# Patient Record
Sex: Female | Born: 1950 | Race: White | Hispanic: No | Marital: Married | Smoking: Current every day smoker
Health system: Southern US, Community
[De-identification: ages and names within clinical notes are randomized; demographics above are authoritative.]

## PROBLEM LIST (undated history)

## (undated) DIAGNOSIS — I639 Cerebral infarction, unspecified: Secondary | ICD-10-CM

## (undated) DIAGNOSIS — F419 Anxiety disorder, unspecified: Secondary | ICD-10-CM

## (undated) DIAGNOSIS — C801 Malignant (primary) neoplasm, unspecified: Secondary | ICD-10-CM

## (undated) DIAGNOSIS — I4891 Unspecified atrial fibrillation: Secondary | ICD-10-CM

## (undated) HISTORY — DX: Anxiety disorder, unspecified: F41.9

## (undated) HISTORY — DX: Malignant (primary) neoplasm, unspecified: C80.1

## (undated) HISTORY — DX: Unspecified atrial fibrillation: I48.91

## (undated) HISTORY — DX: Cerebral infarction, unspecified: I63.9

## (undated) HISTORY — PX: ABDOMINAL HYSTERECTOMY: SHX81

## (undated) HISTORY — PX: KNEE SURGERY: SHX244

---

## 2014-02-21 HISTORY — PX: OTHER SURGICAL HISTORY: SHX169

## 2016-08-18 DIAGNOSIS — M5030 Other cervical disc degeneration, unspecified cervical region: Secondary | ICD-10-CM | POA: Insufficient documentation

## 2016-08-18 DIAGNOSIS — I639 Cerebral infarction, unspecified: Secondary | ICD-10-CM | POA: Insufficient documentation

## 2016-08-18 DIAGNOSIS — I482 Chronic atrial fibrillation, unspecified: Secondary | ICD-10-CM | POA: Insufficient documentation

## 2016-08-18 DIAGNOSIS — I10 Essential (primary) hypertension: Secondary | ICD-10-CM | POA: Insufficient documentation

## 2016-08-18 DIAGNOSIS — E785 Hyperlipidemia, unspecified: Secondary | ICD-10-CM | POA: Insufficient documentation

## 2016-08-18 DIAGNOSIS — Z8673 Personal history of transient ischemic attack (TIA), and cerebral infarction without residual deficits: Secondary | ICD-10-CM | POA: Insufficient documentation

## 2016-08-18 DIAGNOSIS — G894 Chronic pain syndrome: Secondary | ICD-10-CM | POA: Insufficient documentation

## 2016-08-18 DIAGNOSIS — G8929 Other chronic pain: Secondary | ICD-10-CM | POA: Insufficient documentation

## 2016-08-18 DIAGNOSIS — M5412 Radiculopathy, cervical region: Secondary | ICD-10-CM

## 2016-08-18 DIAGNOSIS — G8194 Hemiplegia, unspecified affecting left nondominant side: Secondary | ICD-10-CM | POA: Insufficient documentation

## 2016-10-12 ENCOUNTER — Ambulatory Visit: Payer: Medicare Other | Admitting: Pain Medicine

## 2016-10-14 DIAGNOSIS — F17200 Nicotine dependence, unspecified, uncomplicated: Secondary | ICD-10-CM | POA: Insufficient documentation

## 2016-11-02 ENCOUNTER — Encounter: Payer: Self-pay | Admitting: Pain Medicine

## 2016-11-02 ENCOUNTER — Ambulatory Visit
Admission: RE | Admit: 2016-11-02 | Discharge: 2016-11-02 | Disposition: A | Discharge status: Home / Self Care | Payer: Medicare Other | Source: Ambulatory Visit | Attending: Pain Medicine | Admitting: Pain Medicine

## 2016-11-02 ENCOUNTER — Ambulatory Visit: Payer: Medicare Other | Attending: Pain Medicine | Admitting: Pain Medicine

## 2016-11-02 VITALS — Ht 65.0 in | Wt 101.0 lb

## 2016-11-02 DIAGNOSIS — Z7982 Long term (current) use of aspirin: Secondary | ICD-10-CM | POA: Diagnosis not present

## 2016-11-02 DIAGNOSIS — M24532 Contracture, left wrist: Secondary | ICD-10-CM | POA: Insufficient documentation

## 2016-11-02 DIAGNOSIS — R252 Cramp and spasm: Secondary | ICD-10-CM | POA: Insufficient documentation

## 2016-11-02 DIAGNOSIS — Z7901 Long term (current) use of anticoagulants: Secondary | ICD-10-CM | POA: Diagnosis not present

## 2016-11-02 DIAGNOSIS — G894 Chronic pain syndrome: Secondary | ICD-10-CM | POA: Diagnosis not present

## 2016-11-02 DIAGNOSIS — M79605 Pain in left leg: Secondary | ICD-10-CM | POA: Diagnosis not present

## 2016-11-02 DIAGNOSIS — M4696 Unspecified inflammatory spondylopathy, lumbar region: Secondary | ICD-10-CM | POA: Insufficient documentation

## 2016-11-02 DIAGNOSIS — M791 Myalgia: Secondary | ICD-10-CM | POA: Insufficient documentation

## 2016-11-02 DIAGNOSIS — M24542 Contracture, left hand: Secondary | ICD-10-CM | POA: Insufficient documentation

## 2016-11-02 DIAGNOSIS — Z95828 Presence of other vascular implants and grafts: Secondary | ICD-10-CM | POA: Insufficient documentation

## 2016-11-02 DIAGNOSIS — I4891 Unspecified atrial fibrillation: Secondary | ICD-10-CM | POA: Insufficient documentation

## 2016-11-02 DIAGNOSIS — M4180 Other forms of scoliosis, site unspecified: Secondary | ICD-10-CM | POA: Insufficient documentation

## 2016-11-02 DIAGNOSIS — M542 Cervicalgia: Secondary | ICD-10-CM

## 2016-11-02 DIAGNOSIS — F1190 Opioid use, unspecified, uncomplicated: Secondary | ICD-10-CM | POA: Diagnosis not present

## 2016-11-02 DIAGNOSIS — I10 Essential (primary) hypertension: Secondary | ICD-10-CM | POA: Diagnosis not present

## 2016-11-02 DIAGNOSIS — E785 Hyperlipidemia, unspecified: Secondary | ICD-10-CM | POA: Diagnosis not present

## 2016-11-02 DIAGNOSIS — M5412 Radiculopathy, cervical region: Secondary | ICD-10-CM

## 2016-11-02 DIAGNOSIS — M47896 Other spondylosis, lumbar region: Secondary | ICD-10-CM | POA: Insufficient documentation

## 2016-11-02 DIAGNOSIS — M899 Disorder of bone, unspecified: Secondary | ICD-10-CM | POA: Diagnosis not present

## 2016-11-02 DIAGNOSIS — M5442 Lumbago with sciatica, left side: Secondary | ICD-10-CM | POA: Insufficient documentation

## 2016-11-02 DIAGNOSIS — M5030 Other cervical disc degeneration, unspecified cervical region: Secondary | ICD-10-CM | POA: Diagnosis not present

## 2016-11-02 DIAGNOSIS — G8194 Hemiplegia, unspecified affecting left nondominant side: Secondary | ICD-10-CM

## 2016-11-02 DIAGNOSIS — Z9889 Other specified postprocedural states: Secondary | ICD-10-CM | POA: Insufficient documentation

## 2016-11-02 DIAGNOSIS — M5136 Other intervertebral disc degeneration, lumbar region: Secondary | ICD-10-CM | POA: Insufficient documentation

## 2016-11-02 DIAGNOSIS — I700 Atherosclerosis of aorta: Secondary | ICD-10-CM | POA: Diagnosis not present

## 2016-11-02 DIAGNOSIS — Z79891 Long term (current) use of opiate analgesic: Secondary | ICD-10-CM | POA: Insufficient documentation

## 2016-11-02 DIAGNOSIS — Z90710 Acquired absence of both cervix and uterus: Secondary | ICD-10-CM | POA: Diagnosis not present

## 2016-11-02 DIAGNOSIS — Z809 Family history of malignant neoplasm, unspecified: Secondary | ICD-10-CM | POA: Insufficient documentation

## 2016-11-02 DIAGNOSIS — Z789 Other specified health status: Secondary | ICD-10-CM | POA: Insufficient documentation

## 2016-11-02 DIAGNOSIS — M7918 Myalgia, other site: Secondary | ICD-10-CM

## 2016-11-02 DIAGNOSIS — F17200 Nicotine dependence, unspecified, uncomplicated: Secondary | ICD-10-CM | POA: Insufficient documentation

## 2016-11-02 DIAGNOSIS — Z79899 Other long term (current) drug therapy: Secondary | ICD-10-CM

## 2016-11-02 DIAGNOSIS — M47816 Spondylosis without myelopathy or radiculopathy, lumbar region: Secondary | ICD-10-CM

## 2016-11-02 DIAGNOSIS — G8929 Other chronic pain: Secondary | ICD-10-CM | POA: Insufficient documentation

## 2016-11-02 NOTE — Progress Notes (Signed)
Patient's Name: Chelsea Parker  MRN: [id number]  Referring Provider: Ray Church D*  DOB: [birth year]  PCP: Vira Blanco, MD  DOS: 11/02/2016  Note by: Gaspar Cola, MD  Service setting: Ambulatory outpatient  Specialty: Interventional Pain Management  Location: [facility] (AMB) Pain Management Facility  Visit type: Initial Patient Evaluation  Patient type: New Patient   Primary Reason(s) for Visit: Encounter for initial evaluation of one or more chronic problems (new to examiner) potentially causing chronic pain, and posing a threat to normal musculoskeletal function. (Level of risk: High) CC: Neck Pain  HPI  Chelsea Parker is a 66 y.o. year old, female patient, who comes today to see [place] for the first time for an initial evaluation of her chronic pain. She has Cerebrovascular accident (CVA) due to embolism ([place]); Chronic cervical radicular pain; Chronic atrial fibrillation ([place]); Chronic pain syndrome; DDD (degenerative disc disease), cervical; Essential hypertension; Hyperlipidemia LDL goal <100; Hemiparesis (Left); Smoker; Chronic neck pain; Presence of implanted infusion pump; Long term (current) use of opiate analgesic; Long term prescription opiate use; Opiate use; Chronic low back pain (Primary Area of Pain) (Bilateral) (L>R); Chronic lower extremity pain (Secondary Area of Pain) (Left); Spasticity; Chronic musculoskeletal pain; Finger contractures (Left); Wrist contracture (Left); Chronic anticoagulation; Disorder of skeletal system; Pharmacologic therapy; Problems influencing health status; Thoracolumbar Dextroscoliosis (T12-L1); Lumbar facet hypertrophy (L4-5 and L5-S1); and Lumbar facet syndrome on her problem list. Today she comes in for evaluation of her Neck Pain  Pain Assessment: Location: Left Neck Radiating: radiates down left arm Onset: More than a month ago Duration: Chronic pain Quality: Stabbing, Aching Severity: 8 /10 (self-reported pain score)  Note:  Reported level is inconsistent with clinical observations. Clinically the patient looks like a 5/10 Information on the proper use of the pain scale provided to the patient today Timing: Constant Modifying factors: boost  Onset and Duration: Gradual Cause of pain: stroke Severity: No change since onset, NAS-11 at its worse: 10/10, NAS-11 at its best: 7/10, NAS-11 now: 7/10 and NAS-11 on the average: 8/10 Timing: Not influenced by the time of the day Aggravating Factors: Bending, Motion, Prolonged sitting, Squatting, Twisting, Walking uphill and Walking downhill Alleviating Factors: Lying down and Medications Associated Problems: Fatigue, Inability to concentrate, Pain that wakes patient up and Pain that does not allow patient to sleep Quality of Pain: Aching, Lancinating, Nagging, Sharp and Stabbing Previous Examinations or Tests: MRI scan, Nerve block and Chiropractic evaluation Previous Treatments: Chiropractic manipulations, Narcotic medications, Physical Therapy, Stretching exercises, TENS and Trigger point injections  The patient comes into the clinics today for the first time for a chronic pain management evaluation. The patient has a history that is significant for atrial fibrillation triggering a stroke in June 2014 leading to a hemiplegia that has over time improved to a left hemiparesis but with permanent left hand, wrist, and finger contractures. The patient comes to the clinics referred for an intrathecal pump management upon closure of the [facility] pain clinic in the [place] area. The patient has a [employer] intrathecal pump of 23m running preservative-free Dilaudid 7.5 mg/mL + preservative-free baclofen 400 g/mL, in a simple continuous infusion at 61.36 ucg/day of Dilaudid. The patient currently has 19.4 mL remaining in the pump's reservoir.  Today I took the time to provide the patient with information regarding my pain practice. The patient was informed that my practice is divided  into two sections: an interventional pain management section, as well as a completely separate and distinct medication management section. I  explained that I have procedure days for my interventional therapies, and evaluation days for follow-ups and medication management. Because of the amount of documentation required during both, they are kept separated. This means that there is the possibility that she may be scheduled for a procedure on one day, and medication management the next. I have also informed her that because of staffing and facility limitations, I no longer take patients for medication management only. To illustrate the reasons for this, I gave the patient the example of surgeons, and how inappropriate it would be to refer a patient to his/her care, just to write for the post-surgical antibiotics on a surgery done by a different surgeon.   Because interventional pain management is my board-certified specialty, the patient was informed that joining my practice means that they are open to any and all interventional therapies. I made it clear that this does not mean that they will be forced to have any procedures done. What this means is that I believe interventional therapies to be essential part of the diagnosis and proper management of chronic pain conditions. Therefore, patients not interested in these interventional alternatives will be better served under the care of a different practitioner.  The patient was also made aware of my Comprehensive Pain Management Safety Guidelines where by joining my practice, they limit all of their nerve blocks and joint injections to those done by our practice, for as long as we are retained to manage their care.   Historic Controlled Substance Pharmacotherapy Review  Current opioid analgesics: None  MME/day: 0 mg/day Medications: The patient did not bring the medication(s) to the appointment, as requested in our "New Patient  Package" Pharmacodynamics: Desired effects: Analgesia: The patient reports >50% benefit. Reported improvement in function: The patient reports medication allows her to accomplish basic ADLs. Clinically meaningful improvement in function (CMIF): Sustained CMIF goals met Perceived effectiveness: Described as relatively effective, allowing for increase in activities of daily living (ADL) Undesirable effects: Side-effects or Adverse reactions: None reported Historical Monitoring: The patient  reports that she does not use drugs. List of all UDS Test(s): No results found for: MDMA, COCAINSCRNUR, PCPSCRNUR, PCPQUANT, CANNABQUANT, THCU, [place] List of all Serum Drug Screening Test(s):  No results found for: AMPHSCRSER, BARBSCRSER, BENZOSCRSER, COCAINSCRSER, PCPSCRSER, PCPQUANT, THCSCRSER, CANNABQUANT, OPIATESCRSER, OXYSCRSER, PROPOXSCRSER Historical Background Evaluation: [place] PDMP: Six (6) year initial data search conducted.             [place] Department of public safety, offender search: Editor, commissioning Information) Non-contributory Risk Assessment Profile: Aberrant behavior: None observed or detected today Risk factors for fatal opioid overdose: None identified today Fatal overdose hazard ratio (HR): Calculation deferred Non-fatal overdose hazard ratio (HR): Calculation deferred Risk of opioid abuse or dependence: 0.7-3.0% with doses ? 36 MME/day and 6.1-26% with doses ? 120 MME/day. Substance use disorder (SUD) risk level: Pending results of Medical Psychology Evaluation for SUD Opioid risk tool (ORT) (Total Score): 1     Opioid Risk Tool - 11/02/16 1136      Family History of Substance Abuse   Alcohol Negative   Illegal Drugs Negative   Rx Drugs Negative     Personal History of Substance Abuse   Alcohol Negative   Illegal Drugs Negative   Rx Drugs Negative     Age   Age between 16-45 years  No     Psychological Disease   Depression Positive  anxiety     Total Score   Opioid Risk Tool  Scoring 1  Opioid Risk Interpretation Low Risk     ORT Scoring interpretation table:  Score <3 = Low Risk for SUD  Score between 4-7 = Moderate Risk for SUD  Score >8 = High Risk for Opioid Abuse   PHQ-2 Depression Scale:  Total score: 0  PHQ-2 Scoring interpretation table: (Score and probability of major depressive disorder)  Score 0 = No depression  Score 1 = 15.4% Probability  Score 2 = 21.1% Probability  Score 3 = 38.4% Probability  Score 4 = 45.5% Probability  Score 5 = 56.4% Probability  Score 6 = 78.6% Probability   PHQ-9 Depression Scale:  Total score: 0  PHQ-9 Scoring interpretation table:  Score 0-4 = No depression  Score 5-9 = Mild depression  Score 10-14 = Moderate depression  Score 15-19 = Moderately severe depression  Score 20-27 = Severe depression (2.4 times higher risk of SUD and 2.89 times higher risk of overuse)   Pharmacologic Plan: Pending ordered tests and/or consults            Initial impression: Pending review of available data and ordered tests.  Meds   Current Outpatient Prescriptions:  .  amLODipine (NORVASC) 5 MG tablet, Take by mouth., Disp: , Rfl:  .  apixaban (ELIQUIS) 5 MG TABS tablet, Take 5 mg by mouth daily. , Disp: , Rfl:  .  aspirin 325 MG tablet, Take 325 mg by mouth daily., Disp: , Rfl:  .  atorvastatin (LIPITOR) 80 MG tablet, Take 80 mg by mouth daily at 6 PM. , Disp: , Rfl:  .  baclofen (LIORESAL) 10 MG tablet, Take by mouth., Disp: , Rfl:  .  busPIRone (BUSPAR) 15 MG tablet, Take by mouth., Disp: , Rfl:  .  cetirizine (ZYRTEC) 10 MG tablet, Take 10 mg by mouth daily. , Disp: , Rfl:  .  desonide (DESOWEN) 0.05 % cream, Apply topically., Disp: , Rfl:  .  docusate sodium (COLACE) 100 MG capsule, Take 100 mg by mouth daily. , Disp: , Rfl:  .  fluticasone (FLONASE) 50 MCG/ACT nasal spray, Place into the nose., Disp: , Rfl:  .  metoprolol succinate (TOPROL-XL) 50 MG 24 hr tablet, Take 50 mg by mouth daily. , Disp: , Rfl:  .   solifenacin (VESICARE) 10 MG tablet, Take 10 mg by mouth. , Disp: , Rfl:  .  traZODone (DESYREL) 50 MG tablet, Take 50 mg by mouth at bedtime. , Disp: , Rfl:  .  varenicline (CHANTIX PAK) 0.5 MG X 11 & 1 MG X 42 tablet, Follow package directions., Disp: , Rfl:   Imaging Review   Complexity Note: No results found under the [facility] record.               ROS  Cardiovascular History: Abnormal heart rhythm, Daily Aspirin intake and Blood thinners:  Anticoagulant Pulmonary or Respiratory History: Smoking Neurological History: Stroke (Residual deficits or weakness: weakness) Review of Past Neurological Studies: No results found for this or any previous visit. Psychological-Psychiatric History: Anxiousness Gastrointestinal History: No reported gastrointestinal signs or symptoms such as vomiting or evacuating blood, reflux, heartburn, alternating episodes of diarrhea and constipation, inflamed or scarred liver, or pancreas or irrregular and/or infrequent bowel movements Genitourinary History: No reported renal or genitourinary signs or symptoms such as difficulty voiding or producing urine, peeing blood, non-functioning kidney, kidney stones, difficulty emptying the bladder, difficulty controlling the flow of urine, or chronic kidney disease Hematological History: Brusing easily and Bleeding easily Endocrine History: No reported  endocrine signs or symptoms such as high or low blood sugar, rapid heart rate due to high thyroid levels, obesity or weight gain due to slow thyroid or thyroid disease Rheumatologic History: No reported rheumatological signs and symptoms such as fatigue, joint pain, tenderness, swelling, redness, heat, stiffness, decreased range of motion, with or without associated rash Musculoskeletal History: Negative for myasthenia gravis, muscular dystrophy, multiple sclerosis or malignant hyperthermia Work History: Retired  Allergies  Chelsea Parker has No Known  Allergies.  Laboratory Chemistry  Inflammation Markers (CRP: Acute Phase) (ESR: Chronic Phase) Lab Results  Component Value Date   CRP 1.4 11/02/2016   ESRSEDRATE 2 11/02/2016                 Renal Function Markers Lab Results  Component Value Date   BUN 10 11/02/2016   CREATININE 0.50 (L) 11/02/2016   GFRAA 115 11/02/2016   GFRNONAA 100 11/02/2016                 Hepatic Function Markers Lab Results  Component Value Date   AST 29 11/02/2016   ALT 31 11/02/2016   ALBUMIN 4.7 11/02/2016   ALKPHOS 106 11/02/2016                 Electrolytes Lab Results  Component Value Date   NA 140 11/02/2016   K 4.0 11/02/2016   CL 98 11/02/2016   CALCIUM 9.7 11/02/2016   MG 1.9 11/02/2016                 Neuropathy Markers Lab Results  Component Value Date   VITAMINB12 515 11/02/2016                 Bone Pathology Markers Lab Results  Component Value Date   ALKPHOS 106 11/02/2016   25OHVITD1 25 (L) 11/02/2016   25OHVITD2 1.1 11/02/2016   25OHVITD3 24 11/02/2016   CALCIUM 9.7 11/02/2016                 Coagulation Parameters No results found for: INR, LABPROT, APTT, PLT               Cardiovascular Markers No results found for: BNP, HGB, HCT               Note: Lab results reviewed.  PFSH  Drug: Chelsea Parker  reports that she does not use drugs. Alcohol:  reports that she does not drink alcohol. Tobacco:  reports that she has been smoking Cigarettes.  She has a 47.00 pack-year smoking history. She has never used smokeless tobacco. Medical:  has a past medical history of A-fib ([place]); Anxiety; and Stroke ([place]). Family: family history includes Cancer in her father and mother.  Past Surgical History:  Procedure Laterality Date  . ABDOMINAL HYSTERECTOMY    . IT pump implant  2016  . KNEE SURGERY Right    Active Ambulatory Problems    Diagnosis Date Noted  . Cerebrovascular accident (CVA) due to embolism ([place]) 08/18/2016  . Chronic cervical radicular pain  08/18/2016  . Chronic atrial fibrillation ([place]) 08/18/2016  . Chronic pain syndrome 08/18/2016  . DDD (degenerative disc disease), cervical 08/18/2016  . Essential hypertension 08/18/2016  . Hyperlipidemia LDL goal <100 08/18/2016  . Hemiparesis (Left) 08/18/2016  . Smoker 10/14/2016  . Chronic neck pain 11/02/2016  . Presence of implanted infusion pump 11/02/2016  . Long term (current) use of opiate analgesic 11/02/2016  . Long term prescription opiate use 11/02/2016  . Opiate  use 11/02/2016  . Chronic low back pain (Primary Area of Pain) (Bilateral) (L>R) 11/02/2016  . Chronic lower extremity pain (Secondary Area of Pain) (Left) 11/02/2016  . Spasticity 11/02/2016  . Chronic musculoskeletal pain 11/02/2016  . Finger contractures (Left) 11/02/2016  . Wrist contracture (Left) 11/02/2016  . Chronic anticoagulation 11/11/2016  . Disorder of skeletal system 11/11/2016  . Pharmacologic therapy 11/11/2016  . Problems influencing health status 11/11/2016  . Thoracolumbar Dextroscoliosis (T12-L1) 11/11/2016  . Lumbar facet hypertrophy (L4-5 and L5-S1) 11/11/2016  . Lumbar facet syndrome 11/11/2016   Resolved Ambulatory Problems    Diagnosis Date Noted  . No Resolved Ambulatory Problems   Past Medical History:  Diagnosis Date  . A-fib ([place])   . Anxiety   . Stroke [facility])    Constitutional Exam  General appearance: Well nourished, well developed, and well hydrated. In no apparent acute distress Vitals:   11/02/16 1129  Weight: 101 lb (45.8 kg)  Height: _0  (1.651 m)   BMI Assessment: Estimated body mass index is 16.81 kg/m as calculated from the following:   Height as of this encounter: _1  (1.651 m).   Weight as of this encounter: 101 lb (45.8 kg).  BMI interpretation table: BMI level Category Range association with higher incidence of chronic pain  <18 kg/m2 Underweight   18.5-24.9 kg/m2 Ideal body weight   25-29.9 kg/m2 Overweight Increased incidence by 20%  30-34.9  kg/m2 Obese (Class I) Increased incidence by 68%  35-39.9 kg/m2 Severe obesity (Class II) Increased incidence by 136%  >40 kg/m2 Extreme obesity (Class III) Increased incidence by 254%   BMI Readings from Last 4 Encounters:  11/02/16 16.81 kg/m   Wt Readings from Last 4 Encounters:  11/02/16 101 lb (45.8 kg)  Psych/Mental status: Alert, oriented x 3 (person, place, & time)      Clear evidence of left hemiparesis. Eyes: PERLA Respiratory: No evidence of acute respiratory distress  Cervical Spine Area Exam  Skin & Axial Inspection: No masses, redness, edema, swelling, or associated skin lesions Alignment: Symmetrical Functional ROM: Decreased ROM      Stability: No instability detected Muscle Tone/Strength: Functionally intact. No obvious neuro-muscular anomalies detected. Sensory (Neurological): Unimpaired Palpation: No palpable anomalies              Upper Extremity (UE) Exam    Side: Right upper extremity  Side: Left upper extremity  Skin & Extremity Inspection: Skin color, temperature, and hair growth are WNL. No peripheral edema or cyanosis. No masses, redness, swelling, asymmetry, or associated skin lesions. No contractures.  Skin & Extremity Inspection: Contractures  Functional ROM: Unrestricted ROM          Functional ROM: Minimal ROM for wrist and hand  Muscle Tone/Strength: Functionally intact. No obvious neuro-muscular anomalies detected.  Muscle Tone/Strength: Spastic paralysis  Sensory (Neurological): Unimpaired          Sensory (Neurological): Neurogenic pain pattern          Palpation: No palpable anomalies              Palpation: Complains of area being tender to palpation              Specialized Test(s): Deferred         Specialized Test(s): Deferred          Thoracic Spine Area Exam  Skin & Axial Inspection: Dextroscoliosis with a component of kyphosis Alignment: Symmetrical Functional ROM: Minimal ROM Stability: No instability detected Muscle Tone/Strength:  Functionally intact.  No obvious neuro-muscular anomalies detected. Sensory (Neurological): Movement associated discomfort Muscle strength & Tone: Uncomfortable  Lumbar Spine Area Exam  Skin & Axial Inspection: No masses, redness, or swelling Alignment: Symmetrical Functional ROM: Minimal ROM      Stability: No instability detected Muscle Tone/Strength: Functionally intact. No obvious neuro-muscular anomalies detected. Sensory (Neurological): Movement-associated pain Palpation: Complains of area being tender to palpation       Provocative Tests: Lumbar Hyperextension and rotation test: evaluation deferred today       Lumbar Lateral bending test: evaluation deferred today       Patrick's Maneuver: evaluation deferred today                    Gait & Posture Assessment  Ambulation: Patient ambulates using a cane Gait: Very limited, using assistive device to ambulate Posture: Left hemiparesis   Lower Extremity Exam    Side: Right lower extremity  Side: Left lower extremity  Skin & Extremity Inspection: Skin color, temperature, and hair growth are WNL. No peripheral edema or cyanosis. No masses, redness, swelling, asymmetry, or associated skin lesions. No contractures.  Skin & Extremity Inspection: Dystrophy  Functional ROM: Unrestricted ROM          Functional ROM: Decreased ROM for all joints of the lower extremity  Muscle Tone/Strength: Functionally intact. No obvious neuro-muscular anomalies detected.  Muscle Tone/Strength: Hemiparesis  Sensory (Neurological): Unimpaired  Sensory (Neurological): Neuropathic pain pattern  Palpation: No palpable anomalies  Palpation: No palpable anomalies   Assessment  Primary Diagnosis & Pertinent Problem List: The primary encounter diagnosis was Chronic pain syndrome. Diagnoses of Chronic bilateral low back pain with left-sided sciatica, Chronic neck pain, Chronic pain of left lower extremity, DDD (degenerative disc disease), cervical, Left hemiparesis  (HCC), Spasticity, Chronic musculoskeletal pain, Contracture of finger joint, left, Contracture of left wrist joint, Chronic cervical radicular pain, Disorder of skeletal system, Pharmacologic therapy, Problems influencing health status, Thoracolumbar Dextroscoliosis (T12-L1), Lumbar facet hypertrophy (L4-5 and L5-S1), Lumbar facet syndrome, Presence of implanted infusion pump, Chronic anticoagulation, Long term (current) use of opiate analgesic, Long term prescription opiate use, and Opiate use were also pertinent to this visit.  Visit Diagnosis (New problems to examiner): 1. Chronic pain syndrome   2. Chronic bilateral low back pain with left-sided sciatica   3. Chronic neck pain   4. Chronic pain of left lower extremity   5. DDD (degenerative disc disease), cervical   6. Left hemiparesis (HCC)   7. Spasticity   8. Chronic musculoskeletal pain   9. Contracture of finger joint, left   10. Contracture of left wrist joint   11. Chronic cervical radicular pain   12. Disorder of skeletal system   13. Pharmacologic therapy   14. Problems influencing health status   15. Thoracolumbar Dextroscoliosis (T12-L1)   16. Lumbar facet hypertrophy (L4-5 and L5-S1)   17. Lumbar facet syndrome   18. Presence of implanted infusion pump   19. Chronic anticoagulation   20. Long term (current) use of opiate analgesic   21. Long term prescription opiate use   22. Opiate use    Plan of Care (Initial workup plan)  Note: Chelsea Parker was reminded that as per protocol, today's visit has been an evaluation only. We have not taken over the patient's controlled substance management.  Problem-specific plan: No problem-specific Assessment & Plan notes found for this encounter.   Lab Orders     Compliance Drug Analysis, Ur     Comprehensive metabolic panel  C-reactive protein     Sedimentation rate     Magnesium     25-Hydroxyvitamin D Lcms D2+D3     Vitamin B12  Imaging Orders     DG Lumbar Spine  Complete W/Bend     DG Thoracic Spine 2 View Referral Orders  No referral(s) requested today   Procedure Orders    No procedure(s) ordered today   Pharmacotherapy (current): Medications ordered:  No orders of the defined types were placed in this encounter.  Medications administered during this visit: Chelsea Parker had no medications administered during this visit.   Pharmacological management options:  Opioid Analgesics: The patient was informed that there is no guarantee that she would be a candidate for opioid analgesics. The decision will be made following CDC guidelines. This decision will be based on the results of diagnostic studies, as well as Chelsea Parker's risk profile.   Membrane stabilizer: To be determined at a later time  Muscle relaxant: To be determined at a later time  NSAID: To be determined at a later time  Other analgesic(s): To be determined at a later time   Interventional management options: Chelsea Parker was informed that there is no guarantee that she would be a candidate for interventional therapies. The decision will be based on the results of diagnostic studies, as well as Chelsea Parker's risk profile.  Procedure(s) under consideration:  Intrathecal pump refill    Provider-requested follow-up: Return for Pump Refill (as per pump program).  Future Appointments Date Time Provider [place]  12/21/2016 1:00 PM Milinda Pointer, MD [facility] None    Primary Care Physician: Vira Blanco, MD Location: [facility] Management Facility Note by: Gaspar Cola, MD Date: 11/02/2016; Time: 1:39 PM

## 2016-11-02 NOTE — Progress Notes (Signed)
Safety precautions to be maintained throughout the outpatient stay will include: orient to surroundings, keep bed in low position, maintain call bell within reach at all times, provide assistance with transfer out of bed and ambulation.  

## 2016-11-02 NOTE — Patient Instructions (Addendum)
____________________________________________________________________________________________  Appointment Policy Summary  It is our goal and responsibility to provide the medical community with assistance in the evaluation and management of patients with chronic pain. Unfortunately our resources are limited. Because we do not have an unlimited amount of time, or available appointments, we are required to closely monitor and manage their use. The following rules exist to maximize their use:  Patient's responsibilities: 1. Punctuality:  At what time should I arrive? You should be physically present in our office 30 minutes before your scheduled appointment. Your scheduled appointment is with your assigned healthcare provider. However, it takes 5-10 minutes to be "checked-in", and another 15 minutes for the nurses to do the admission. If you arrive to our office at the time you were given for your appointment, you will end up being at least 20-25 minutes late to your appointment with the provider. 2. Tardiness:  What happens if I arrive only a few minutes after my scheduled appointment time? You will need to reschedule your appointment. The cutoff is your appointment time. This is why it is so important that you arrive at least 30 minutes before that appointment. If you have an appointment scheduled for 10:00 AM and you arrive at 10:01, you will be required to reschedule your appointment.  3. Plan ahead:  Always assume that you will encounter traffic on your way in. Plan for it. If you are dependent on a driver, make sure they understand these rules and the need to arrive early. 4. Other appointments and responsibilities:  Avoid scheduling any other appointments before or after your pain clinic appointments.  5. Be prepared:  Write down everything that you need to discuss with your healthcare provider and give this information to the admitting nurse. Write down the medications that you will need  refilled. Bring your pills and bottles (even the empty ones), to all of your appointments, except for those where a procedure is scheduled. 6. No children or pets:  Find someone to take care of them. It is not appropriate to bring them in. 7. Scheduling changes:  We request "advanced notification" of any changes or cancellations. 8. Advanced notification:  Defined as a time period of more than 24 hours prior to the originally scheduled appointment. This allows for the appointment to be offered to other patients. 9. Rescheduling:  When a visit is rescheduled, it will require the cancellation of the original appointment. For this reason they both fall within the category of "Cancellations".  10. Cancellations:  They require advanced notification. Any cancellation less than 24 hours before the  appointment will be recorded as a "No Show". 11. No Show:  Defined as an unkept appointment where the patient failed to notify or declare to the practice their intention or inability to keep the appointment.  Corrective process for repeat offenders:  1. Tardiness: Three (3) episodes of rescheduling due to late arrivals will be recorded as one (1) "No Show". 2. Cancellation or reschedule: Three (3) cancellations or rescheduling will be recorded as one (1) "No Show". 3. "No Shows": Three (3) "No Shows" within a 12 month period will result in discharge from the practice.  ____________________________________________________________________________________________  ____________________________________________________________________________________________  Pain Scale  Introduction: The pain score used by this practice is the Verbal Numerical Rating Scale (VNRS-11). This is an 11-point scale. It is for adults and children 10 years or older. There are significant differences in how the pain score is reported, used, and applied. Forget everything you learned in the past and  learn this scoring  system.  General Information: The scale should reflect your current level of pain. Unless you are specifically asked for the level of your worst pain, or your average pain. If you are asked for one of these two, then it should be understood that it is over the past 24 hours.  Basic Activities of Daily Living (ADL): Personal hygiene, dressing, eating, transferring, and using restroom.  Instructions: Most patients tend to report their level of pain as a combination of two factors, their physical pain and their psychosocial pain. This last one is also known as "suffering" and it is reflection of how physical pain affects you socially and psychologically. From now on, report them separately. From this point on, when asked to report your pain level, report only your physical pain. Use the following table for reference.  Pain Clinic Pain Levels (0-5/10)  Pain Level Score  Description  No Pain 0   Mild pain 1 Nagging, annoying, but does not interfere with basic activities of daily living (ADL). Patients are able to eat, bathe, get dressed, toileting (being able to get on and off the toilet and perform personal hygiene functions), transfer (move in and out of bed or a chair without assistance), and maintain continence (able to control bladder and bowel functions). Blood pressure and heart rate are unaffected. A normal heart rate for a healthy adult ranges from 60 to 100 bpm (beats per minute).   Mild to moderate pain 2 Noticeable and distracting. Impossible to hide from other people. More frequent flare-ups. Still possible to adapt and function close to normal. It can be very annoying and may have occasional stronger flare-ups. With discipline, patients may get used to it and adapt.   Moderate pain 3 Interferes significantly with activities of daily living (ADL). It becomes difficult to feed, bathe, get dressed, get on and off the toilet or to perform personal hygiene functions. Difficult to get in and out of  bed or a chair without assistance. Very distracting. With effort, it can be ignored when deeply involved in activities.   Moderately severe pain 4 Impossible to ignore for more than a few minutes. With effort, patients may still be able to manage work or participate in some social activities. Very difficult to concentrate. Signs of autonomic nervous system discharge are evident: dilated pupils (mydriasis); mild sweating (diaphoresis); sleep interference. Heart rate becomes elevated (>115 bpm). Diastolic blood pressure (lower number) rises above 100 mmHg. Patients find relief in laying down and not moving.   Severe pain 5 Intense and extremely unpleasant. Associated with frowning face and frequent crying. Pain overwhelms the senses.  Ability to do any activity or maintain social relationships becomes significantly limited. Conversation becomes difficult. Pacing back and forth is common, as getting into a comfortable position is nearly impossible. Pain wakes you up from deep sleep. Physical signs will be obvious: pupillary dilation; increased sweating; goosebumps; brisk reflexes; cold, clammy hands and feet; nausea, vomiting or dry heaves; loss of appetite; significant sleep disturbance with inability to fall asleep or to remain asleep. When persistent, significant weight loss is observed due to the complete loss of appetite and sleep deprivation.  Blood pressure and heart rate becomes significantly elevated. Caution: If elevated blood pressure triggers a pounding headache associated with blurred vision, then the patient should immediately seek attention at an urgent or emergency care unit, as these may be signs of an impending stroke.    Emergency Department Pain Levels (6-10/10)  Emergency Room Pain 6   Severely limiting. Requires emergency care and should not be seen or managed at an outpatient pain management facility. Communication becomes difficult and requires great effort. Assistance to reach the  emergency department may be required. Facial flushing and profuse sweating along with potentially dangerous increases in heart rate and blood pressure will be evident.   Distressing pain 7 Self-care is very difficult. Assistance is required to transport, or use restroom. Assistance to reach the emergency department will be required. Tasks requiring coordination, such as bathing and getting dressed become very difficult.   Disabling pain 8 Self-care is no longer possible. At this level, pain is disabling. The individual is unable to do even the most "basic" activities such as walking, eating, bathing, dressing, transferring to a bed, or toileting. Fine motor skills are lost. It is difficult to think clearly.   Incapacitating pain 9 Pain becomes incapacitating. Thought processing is no longer possible. Difficult to remember your own name. Control of movement and coordination are lost.   The worst pain imaginable 10 At this level, most patients pass out from pain. When this level is reached, collapse of the autonomic nervous system occurs, leading to a sudden drop in blood pressure and heart rate. This in turn results in a temporary and dramatic drop in blood flow to the brain, leading to a loss of consciousness. Fainting is one of the body's self defense mechanisms. Passing out puts the brain in a calmed state and causes it to shut down for a while, in order to begin the healing process.    Summary: 1. Refer to this scale when providing [place] with your pain level. 2. Be accurate and careful when reporting your pain level. This will help with your care. 3. Over-reporting your pain level will lead to loss of credibility. 4. Even a level of 1/10 means that there is pain and will be treated at our facility. 5. High, inaccurate reporting will be documented as "Symptom Exaggeration", leading to loss of credibility and suspicions of possible secondary gains such as obtaining more narcotics, or wanting to appear  disabled, for fraudulent reasons. 6. Only pain levels of 5 or below will be seen at our facility. 7. Pain levels of 6 and above will be sent to the Emergency Department and the appointment cancelled. ____________________________________________________________________________________________  Pain Management Discharge Instructions  General Discharge Instructions :  If you need to reach your doctor call: Monday-Friday 8:00 am - 4:00 pm at [phone] or toll free [phone].  After clinic hours [phone] to have operator reach doctor.  Bring all of your medication bottles to all your appointments in the pain clinic.  To cancel or reschedule your appointment with Pain Management please remember to call 24 hours in advance to avoid a fee.  Refer to the educational materials which you have been given on: General Risks, I had my Procedure. Discharge Instructions, Post Sedation.  Post Procedure Instructions:  The drugs you were given will stay in your system until tomorrow, so for the next 24 hours you should not drive, make any legal decisions or drink any alcoholic beverages.  You may eat anything you prefer, but it is better to start with liquids then soups and crackers, and gradually work up to solid foods.  Please notify your doctor immediately if you have any unusual bleeding, trouble breathing or pain that is not related to your normal pain.  Depending on the type of procedure that was done, some parts of your body may feel week and/or numb.  This usually clears up by tonight or the next day.  Walk with the use of an assistive device or accompanied by an adult for the 24 hours.  You may use ice on the affected area for the first 24 hours.  Put ice in a Ziploc bag and cover with a towel and place against area 15 minutes on 15 minutes off.  You may switch to heat after 24 hours.Pain Management Discharge Instructions  General Discharge Instructions :  If you need to reach your  doctor call: Monday-Friday 8:00 am - 4:00 pm at [phone] or toll free [phone].  After clinic hours [phone] to have operator reach doctor.  Bring all of your medication bottles to all your appointments in the pain clinic.  To cancel or reschedule your appointment with Pain Management please remember to call 24 hours in advance to avoid a fee.  Refer to the educational materials which you have been given on: General Risks, I had my Procedure. Discharge Instructions, Post Sedation.  Post Procedure Instructions:  The drugs you were given will stay in your system until tomorrow, so for the next 24 hours you should not drive, make any legal decisions or drink any alcoholic beverages.  You may eat anything you prefer, but it is better to start with liquids then soups and crackers, and gradually work up to solid foods.  Please notify your doctor immediately if you have any unusual bleeding, trouble breathing or pain that is not related to your normal pain.  Depending on the type of procedure that was done, some parts of your body may feel week and/or numb.  This usually clears up by tonight or the next day.  Walk with the use of an assistive device or accompanied by an adult for the 24 hours.  You may use ice on the affected area for the first 24 hours.  Put ice in a Ziploc bag and cover with a towel and place against area 15 minutes on 15 minutes off.  You may switch to heat after 24 hours.

## 2016-11-09 LAB — COMPREHENSIVE METABOLIC PANEL
A/G RATIO: 2.1 (ref 1.2–2.2)
ALK PHOS: 106 IU/L (ref 39–117)
ALT: 31 IU/L (ref 0–32)
AST: 29 IU/L (ref 0–40)
Albumin: 4.7 g/dL (ref 3.6–4.8)
BILIRUBIN TOTAL: 0.4 mg/dL (ref 0.0–1.2)
BUN/Creatinine Ratio: 20 (ref 12–28)
BUN: 10 mg/dL (ref 8–27)
CHLORIDE: 98 mmol/L (ref 96–106)
CO2: 27 mmol/L (ref 20–29)
Calcium: 9.7 mg/dL (ref 8.7–10.3)
Creatinine, Ser: 0.5 mg/dL — ABNORMAL LOW (ref 0.57–1.00)
GFR calc Af Amer: 115 mL/min/{1.73_m2} (ref >59)
GFR calc non Af Amer: 100 mL/min/{1.73_m2} (ref >59)
GLOBULIN, TOTAL: 2.2 g/dL (ref 1.5–4.5)
Glucose: 96 mg/dL (ref 65–99)
POTASSIUM: 4 mmol/L (ref 3.5–5.2)
SODIUM: 140 mmol/L (ref 134–144)
Total Protein: 6.9 g/dL (ref 6.0–8.5)

## 2016-11-09 LAB — SEDIMENTATION RATE: SED RATE: 2 mm/h (ref 0–40)

## 2016-11-09 LAB — 25-HYDROXY VITAMIN D LCMS D2+D3: 25-Hydroxy, Vitamin D-3: 24 ng/mL

## 2016-11-09 LAB — 25-HYDROXYVITAMIN D LCMS D2+D3
25-HYDROXY, VITAMIN D-2: 1.1 ng/mL
25-HYDROXY, VITAMIN D: 25 ng/mL — AB

## 2016-11-09 LAB — C-REACTIVE PROTEIN: CRP: 1.4 mg/L (ref 0.0–4.9)

## 2016-11-09 LAB — MAGNESIUM: Magnesium: 1.9 mg/dL (ref 1.6–2.3)

## 2016-11-09 LAB — VITAMIN B12: VITAMIN B 12: 515 pg/mL (ref 232–1245)

## 2016-11-10 LAB — COMPLIANCE DRUG ANALYSIS, UR

## 2016-11-11 DIAGNOSIS — M899 Disorder of bone, unspecified: Secondary | ICD-10-CM | POA: Insufficient documentation

## 2016-11-11 DIAGNOSIS — Z789 Other specified health status: Secondary | ICD-10-CM | POA: Insufficient documentation

## 2016-11-11 DIAGNOSIS — M47816 Spondylosis without myelopathy or radiculopathy, lumbar region: Secondary | ICD-10-CM | POA: Insufficient documentation

## 2016-11-11 DIAGNOSIS — M4180 Other forms of scoliosis, site unspecified: Secondary | ICD-10-CM | POA: Insufficient documentation

## 2016-11-11 DIAGNOSIS — Z7901 Long term (current) use of anticoagulants: Secondary | ICD-10-CM | POA: Insufficient documentation

## 2016-11-11 DIAGNOSIS — Z79899 Other long term (current) drug therapy: Secondary | ICD-10-CM | POA: Insufficient documentation

## 2016-12-05 ENCOUNTER — Other Ambulatory Visit: Payer: Self-pay

## 2016-12-05 DIAGNOSIS — G894 Chronic pain syndrome: Secondary | ICD-10-CM

## 2016-12-05 MED ORDER — PAIN MANAGEMENT IT PUMP REFILL: 1.0000 | Freq: Once | INTRATHECAL | 0 refills | Status: AC

## 2016-12-05 MED ORDER — PAIN MANAGEMENT IT PUMP REFILL: 1.0000 | Freq: Once | INTRATHECAL | Status: DC

## 2016-12-20 NOTE — Progress Notes (Signed)
Patient's Name: Chelsea Parker  MRN: [id number]  Referring Provider: Ray Church D*  DOB: 1950/06/06  PCP: Vira Blanco, MD  DOS: 12/21/2016  Note by: Gaspar Cola, MD  Service setting: Ambulatory outpatient  Specialty: Interventional Pain Management  Patient type: Established  Location: [facility] (AMB) Pain Management Facility  Visit type: Interventional Procedure   Primary Reason for Visit: Interventional Pain Management Treatment. CC: Neck Pain (mid)  Procedure:  Intrathecal Drug Delivery System (IDDS):  Type: Reservoir Refill [phone]) No rate change Region: Abdominal Laterality: Left  Type of Pump: Medtronic Synchromed II (MRI-compatible) Delivery Route: Intrathecal Type of Pain Treated: Neuropathic/Nociceptive Primary Medication Class: Opioid/opiate  Medication, Concentration, Infusion Program, & Delivery Rate: Please see scanned programming printout.   Indications: 1. Chronic pain syndrome   2. Chronic low back pain (Primary Area of Pain) (Bilateral) (L>R)   3. Chronic lower extremity pain (Secondary Area of Pain) (Left)   4. Lumbar facet hypertrophy (L4-5 and L5-S1)   5. Lumbar facet syndrome   6. Hemiparesis (Left)   7. Spasticity   8. Pharmacologic therapy   9. Presence of implanted infusion pump    Pain Assessment: Self-Reported Pain Score: 7 /10             Reported level is compatible with observation.        Intrathecal Pump Therapy Assessment  Manufacturer: Medtronic Synchromed II Type: Programmable Volume: 20 mL reservoir MRI compatibility: Yes   Drug content:  Primary Medication Class: Opioid Primary Medication: PF-Hydromorphone (Dilaudid) 7.5 mg/ml Secondary Medication: PF-Baclofen 400 mcg/ml Other Medication: None   Programming:  Type: Complex with PCA. See pump readout for details.   Changes:  Medication Change: None at this point Rate Change: No change in rate  Reported side-effects or adverse reactions: None  reported  Effectiveness: Described as relatively effective, allowing for increase in activities of daily living (ADL) Clinically meaningful improvement in function (CMIF): Sustained CMIF goals met  Plan: Pump refill today  Pre-op Assessment:  [name] is a 66 y.o. (year old), female patient, seen today for interventional treatment. She  has a past surgical history that includes Abdominal hysterectomy; Knee surgery (Right); and IT pump implant (2016). [name] has a current medication list which includes the following prescription(s): amlodipine, apixaban, atorvastatin, baclofen, buspirone, cetirizine, docusate sodium, fluticasone, metoprolol succinate, solifenacin, and trazodone. 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 The patient comes to the clinics referred for an intrathecal pump management upon closure of the [facility] pain clinic in the [place] area. The patient has a Medtronic intrathecal pump of 57m running preservative-free Dilaudid 7.5 mg/mL + preservative-free baclofen 400 g/mL, in a simple continuous infusion at 61.36 ucg/day of Dilaudid. According to the medical records, the patient apparently recently had another stroke.  Initial Vital Signs: There were no vitals taken for this visit. BMI: Estimated body mass index is 17.53 kg/m as calculated from the following:   Height as of this encounter: 5' 5.5" (1.664 m).   Weight as of this encounter: 107 lb (48.5 kg).  Risk Assessment: Allergies: Reviewed. She has No Known Allergies.  Allergy Precautions: None required Coagulopathies: Reviewed. None identified.  Blood-thinner therapy: None at this time Active Infection(s): Reviewed. None identified. [name] afebrile  Site  Confirmation: [name] was asked to confirm the procedure and laterality before marking the site Procedure checklist: Completed Consent: Before the procedure and under the influence of no sedative(s), amnesic(s), or anxiolytics, the patient was informed of the treatment options, risks and possible complications. To fulfill our ethical and legal obligations, as recommended by the American Medical Association's Code of Ethics, I have informed the patient of my clinical impression; the nature and purpose of the treatment or procedure; the risks, benefits, and possible complications of the intervention; the alternatives, including doing nothing; the risk(s) and benefit(s) of the alternative treatment(s) or procedure(s); and the risk(s) and benefit(s) of doing nothing.  [name] was provided with information about the general risks and possible complications associated with most interventional procedures. These include, but are not limited to: failure to achieve desired goals, infection, bleeding, organ or nerve damage, allergic reactions, paralysis, and/or death.  In addition, she was informed of those risks and possible complications associated to this particular procedure, which include, but are not limited to: damage to the implant; failure to decrease pain; local, systemic, or serious CNS infections, intraspinal abscess with possible cord compression and paralysis, or life-threatening such as meningitis; bleeding; organ damage; nerve injury or damage with subsequent sensory, motor, and/or autonomic system dysfunction, resulting in transient or permanent pain, numbness, and/or weakness of one or several areas of the body; allergic reactions, either minor or major life-threatening, such as anaphylactic or anaphylactoid reactions.  Furthermore, [name] was informed of those risks and complications associated with the medications. These include, but are not limited to: allergic reactions (i.e.:  anaphylactic or anaphylactoid reactions); endorphine suppression; bradycardia and/or hypotension; water retention and/or peripheral vascular relaxation leading to lower extremity edema and possible stasis ulcers; respiratory depression and/or shortness of breath; decreased metabolic rate leading to weight gain; swelling or edema; medication-induced neural toxicity; particulate matter embolism and blood vessel occlusion with resultant organ, and/or nervous system infarction; and/or intrathecal granuloma formation with possible spinal cord compression and permanent paralysis.  Before refilling the pump Ms. Haskew was informed that some of the medications used in the devise may not be FDA approved for such use and therefore it constitutes an off-label use of the medications.  Finally, she was informed that Medicine is not an exact science; therefore, there is also the possibility of unforeseen or unpredictable risks and/or possible complications that may result in a catastrophic outcome. The patient indicated having understood very clearly. We have given the patient no guarantees and we have made no promises. Enough time was given to the patient to ask questions, all of which were answered to the patient's satisfaction. Ms. Giebler has indicated that she wanted to continue with the procedure. Attestation: I, the ordering provider, attest that I have discussed with the patient the benefits, risks, side-effects, alternatives, likelihood of achieving goals, and potential problems during recovery for the procedure that I have provided informed consent. Date: 12/21/2016; Time: 12:25 PM  Pre-Procedure Preparation:  Monitoring: As per clinic protocol. Respiration, ETCO2, SpO2, BP, heart rate and rhythm monitor placed and checked for adequate function Safety Precautions: Patient was assessed for positional comfort and pressure points before starting the procedure. Time-out: I initiated and conducted the "Time-out"  before starting the procedure, as per protocol. The patient was asked to participate by confirming the accuracy of the "Time Out" information. Verification of the correct person, site, and procedure were performed and confirmed by me, the nursing staff, and the patient. "Time-out" conducted as per Joint Commission's  Universal Protocol (UP.01.01.01). "Time-out" Date & Time: 12/21/2016; (P) 1355 hrs.  Description of Procedure Process:   Position: Supine Target Area: Central-port of intrathecal pump. Approach: Anterior, 90 degree angle approach. Area Prepped: Entire Area around the pump implant. Prepping solution: ChloraPrep (2% chlorhexidine gluconate and 70% isopropyl alcohol) Safety Precautions: Aspiration looking for blood return was conducted prior to all injections. At no point did we inject any substances, as a needle was being advanced. No attempts were made at seeking any paresthesias. Safe injection practices and needle disposal techniques used. Medications properly checked for expiration dates. SDV (single dose vial) medications used. Description of the Procedure: Protocol guidelines were followed. Two nurses trained to do implant refills were present during the entire procedure. The refill medication was checked by both healthcare providers as well as the patient. The patient was included in the "Time-out" to verify the medication. The patient was placed in position. The pump was identified. The area was prepped in the usual manner. The sterile template was positioned over the pump, making sure the side-port location matched that of the pump. Both, the pump and the template were held for stability. The needle provided in the Medtronic Kit was then introduced thru the center of the template and into the central port. The pump content was aspirated and discarded volume documented. The new medication was slowly infused into the pump, thru the filter, making sure to avoid overpressure of the device.  The needle was then removed and the area cleansed, making sure to leave some of the prepping solution back to take advantage of its long term bactericidal properties. The pump was interrogated and programmed to reflect the correct medication, volume, and dosage. The program was printed and taken to the physician for approval. Once checked and signed by the physician, a copy was provided to the patient and another scanned into the EMR. Vitals:   12/21/16 1253  BP: 127/85  Pulse: 83  Resp: 16  Temp: 97.8 F (36.6 C)  SpO2: 94%  Weight: 107 lb (48.5 kg)  Height: 5' 5.5" (1.664 m)    Start Time: (P) 1358 hrs. End Time:   hrs. Materials & Medications: Medtronic Refill Kit Medication(s): Please see chart orders for details.  Imaging Guidance:  Type of Imaging Technique: None used Indication(s): N/A Exposure Time: No patient exposure Contrast: None used. Fluoroscopic Guidance: N/A Ultrasound Guidance: N/A Interpretation: N/A  Antibiotic Prophylaxis:  Indication(s): None identified Antibiotic given: None  Post-operative Assessment:  EBL: None Complications: No immediate post-treatment complications observed by team, or reported by patient. Note: The patient tolerated the entire procedure well. A repeat set of vitals were taken after the procedure and the patient was kept under observation following institutional policy, for this type of procedure. Post-procedural neurological assessment was performed, showing return to baseline, prior to discharge. The patient was provided with post-procedure discharge instructions, including a section on how to identify potential problems. Should any problems arise concerning this procedure, the patient was given instructions to immediately contact us, at any time, without hesitation. In any case, we plan to contact the patient by telephone for a follow-up status report regarding this interventional procedure. Comments:  No additional relevant  information.  Plan of Care   Imaging Orders  No imaging studies ordered today    Procedure Orders     PUMP REFILL     PUMP REFILL  Medications ordered for procedure: No orders of the defined types were placed in this encounter.  Medications administered: Ms. Dena  had no medications administered during this visit.  See the medical record for exact dosing, route, and time of administration.  New Prescriptions   No medications on file   Disposition: Discharge home  Discharge Date & Time: 12/21/2016; 1435 hrs.   Physician-requested Follow-up: Return for Pump Refill (as per pump program). Future Appointments Date Time Provider [place]  02/02/2017 11:00 AM Milinda Pointer, MD [facility] None   Primary Care Physician: Vira Blanco, MD Location: [facility] Note by: Gaspar Cola, MD Date: 12/21/2016; Time: 4:18 PM  Disclaimer:  Medicine is not an Chief Strategy Officer. The only guarantee in medicine is that nothing is guaranteed. It is important to note that the decision to proceed with this intervention was based on the information collected from the patient. The Data and conclusions were drawn from the patient's questionnaire, the interview, and the physical examination. Because the information was provided in large part by the patient, it cannot be guaranteed that it has not been purposely or unconsciously manipulated. Every effort has been made to obtain as much relevant data as possible for this evaluation. It is important to note that the conclusions that lead to this procedure are derived in large part from the available data. Always take into account that the treatment will also be dependent on availability of resources and existing treatment guidelines, considered by other Pain Management Practitioners as being common knowledge and practice, at the time of the intervention. For Medico-Legal purposes, it is also important  to point out that variation in procedural techniques and pharmacological choices are the acceptable norm. The indications, contraindications, technique, and results of the above procedure should only be interpreted and judged by a Board-Certified Interventional Pain Specialist with extensive familiarity and expertise in the same exact procedure and technique.

## 2016-12-21 ENCOUNTER — Encounter: Payer: Self-pay | Admitting: Pain Medicine

## 2016-12-21 ENCOUNTER — Ambulatory Visit: Payer: Medicare Other | Attending: Pain Medicine | Admitting: Pain Medicine

## 2016-12-21 VITALS — BP 127/85 | HR 83 | Temp 97.8°F | Resp 16 | Ht 65.5 in | Wt 107.0 lb

## 2016-12-21 DIAGNOSIS — M8938 Hypertrophy of bone, other site: Secondary | ICD-10-CM | POA: Insufficient documentation

## 2016-12-21 DIAGNOSIS — M79605 Pain in left leg: Secondary | ICD-10-CM | POA: Insufficient documentation

## 2016-12-21 DIAGNOSIS — Z451 Encounter for adjustment and management of infusion pump: Secondary | ICD-10-CM

## 2016-12-21 DIAGNOSIS — M5442 Lumbago with sciatica, left side: Secondary | ICD-10-CM

## 2016-12-21 DIAGNOSIS — G894 Chronic pain syndrome: Secondary | ICD-10-CM | POA: Diagnosis not present

## 2016-12-21 DIAGNOSIS — R252 Cramp and spasm: Secondary | ICD-10-CM

## 2016-12-21 DIAGNOSIS — M542 Cervicalgia: Secondary | ICD-10-CM | POA: Diagnosis not present

## 2016-12-21 DIAGNOSIS — I69354 Hemiplegia and hemiparesis following cerebral infarction affecting left non-dominant side: Secondary | ICD-10-CM | POA: Diagnosis not present

## 2016-12-21 DIAGNOSIS — Z79899 Other long term (current) drug therapy: Secondary | ICD-10-CM

## 2016-12-21 DIAGNOSIS — Z95828 Presence of other vascular implants and grafts: Secondary | ICD-10-CM

## 2016-12-21 DIAGNOSIS — M47816 Spondylosis without myelopathy or radiculopathy, lumbar region: Secondary | ICD-10-CM

## 2016-12-21 DIAGNOSIS — G8194 Hemiplegia, unspecified affecting left nondominant side: Secondary | ICD-10-CM

## 2016-12-21 DIAGNOSIS — G8929 Other chronic pain: Secondary | ICD-10-CM

## 2016-12-22 MED FILL — Medication: INTRATHECAL | Qty: 1 | Status: AC

## 2017-01-04 ENCOUNTER — Other Ambulatory Visit: Payer: Self-pay

## 2017-01-04 DIAGNOSIS — Z85828 Personal history of other malignant neoplasm of skin: Secondary | ICD-10-CM | POA: Insufficient documentation

## 2017-01-04 MED ORDER — PAIN MANAGEMENT IT PUMP REFILL: 1.0000 | Freq: Once | INTRATHECAL | 0 refills | Status: AC

## 2017-01-31 ENCOUNTER — Encounter: Payer: Medicare Other | Admitting: Pain Medicine

## 2017-02-02 ENCOUNTER — Encounter: Payer: Medicare Other | Admitting: Pain Medicine

## 2017-02-07 ENCOUNTER — Ambulatory Visit: Payer: Medicare Other | Attending: Pain Medicine | Admitting: Pain Medicine

## 2017-02-07 ENCOUNTER — Other Ambulatory Visit: Payer: Self-pay

## 2017-02-07 ENCOUNTER — Encounter: Payer: Self-pay | Admitting: Pain Medicine

## 2017-02-07 VITALS — BP 131/80 | HR 81 | Temp 98.1°F | Resp 16 | Ht 65.0 in | Wt 106.0 lb

## 2017-02-07 DIAGNOSIS — Z95828 Presence of other vascular implants and grafts: Secondary | ICD-10-CM | POA: Insufficient documentation

## 2017-02-07 DIAGNOSIS — Z79899 Other long term (current) drug therapy: Secondary | ICD-10-CM | POA: Diagnosis not present

## 2017-02-07 DIAGNOSIS — R252 Cramp and spasm: Secondary | ICD-10-CM | POA: Diagnosis not present

## 2017-02-07 DIAGNOSIS — G8194 Hemiplegia, unspecified affecting left nondominant side: Secondary | ICD-10-CM

## 2017-02-07 DIAGNOSIS — M5442 Lumbago with sciatica, left side: Secondary | ICD-10-CM | POA: Insufficient documentation

## 2017-02-07 DIAGNOSIS — M79605 Pain in left leg: Secondary | ICD-10-CM

## 2017-02-07 DIAGNOSIS — M542 Cervicalgia: Secondary | ICD-10-CM | POA: Diagnosis not present

## 2017-02-07 DIAGNOSIS — G8929 Other chronic pain: Secondary | ICD-10-CM

## 2017-02-07 DIAGNOSIS — G894 Chronic pain syndrome: Secondary | ICD-10-CM

## 2017-02-07 DIAGNOSIS — Z9641 Presence of insulin pump (external) (internal): Secondary | ICD-10-CM | POA: Insufficient documentation

## 2017-02-07 DIAGNOSIS — M5441 Lumbago with sciatica, right side: Secondary | ICD-10-CM | POA: Insufficient documentation

## 2017-02-07 NOTE — Progress Notes (Signed)
Patient's Name: Chelsea Parker  MRN: [id number]  Referring Provider: Ray Church D*  DOB: October 22, 1950  PCP: Vira Blanco, MD  DOS: 02/07/2017  Note by: Gaspar Cola, MD  Service setting: Ambulatory outpatient  Specialty: Interventional Pain Management  Patient type: Established  Location: [facility] (AMB) Pain Management Facility  Visit type: Interventional Procedure   Primary Reason for Visit: Interventional Pain Management Treatment. CC: Neck Pain  Procedure:  Intrathecal Drug Delivery System (IDDS):  Type: Reservoir Refill [phone]) No rate change Region: Abdominal Laterality: Left  Type of Pump: Medtronic Synchromed II (MRI-compatible) Delivery Route: Intrathecal Type of Pain Treated: Neuropathic/Nociceptive Primary Medication Class: Opioid/opiate  Medication, Concentration, Infusion Program, & Delivery Rate: Please see scanned programming printout.   Indications: 1. Chronic pain syndrome   2. Chronic low back pain (Primary Area of Pain) (Bilateral) (L>R)   3. Chronic lower extremity pain (Secondary Area of Pain) (Left)   4. Hemiparesis (Left)   5. Spasticity   6. Presence of implanted infusion pump   7. Pharmacologic therapy    Pain Assessment: Self-Reported Pain Score: 7 /10             Reported level is compatible with observation.        Intrathecal Pump Therapy Assessment  Manufacturer: Medtronic Synchromed II Type: Programmable Volume: 20 mL reservoir MRI compatibility: Yes   Drug content:  Primary Medication Class: Opioid Primary Medication: PF-Hydromorphone (Dilaudid) 7.5 mg/ml Secondary Medication: PF-Baclofen 400 mcg/ml Other Medication: see pump readout   Programming:  Type: Simple continuous. See pump readout for details. The patient does have a patient-controlled dose programmed.  Changes:  Medication Change: None at this point Rate Change: No change in rate  Reported side-effects or adverse reactions: None  reported  Effectiveness: Described as relatively effective, allowing for increase in activities of daily living (ADL) Clinically meaningful improvement in function (CMIF): Sustained CMIF goals met  Plan: Pump refill today  Pre-op Assessment:  Chelsea Parker is a 66 y.o. (year old), female patient, seen today for interventional treatment. She  has a past surgical history that includes Abdominal hysterectomy; Knee surgery (Right); and IT pump implant (2016). Chelsea Parker has a current medication list which includes the following prescription(s): amlodipine, apixaban, atorvastatin, baclofen, buspirone, cetirizine, docusate sodium, fluticasone, metoprolol succinate, solifenacin, and trazodone. Her primarily concern today is the Neck Pain  Initial Vital Signs: Blood pressure 131/80, pulse 81, temperature 98.1 F (36.7 C), resp. rate 16, height 5' 5" (1.651 m), weight 106 lb (48.1 kg), SpO2 96 %. BMI: Estimated body mass index is 17.64 kg/m as calculated from the following:   Height as of this encounter: 5' 5" (1.651 m).   Weight as of this encounter: 106 lb (48.1 kg).  Risk Assessment: Allergies: Reviewed. She has No Known Allergies.  Allergy Precautions: None required Coagulopathies: Reviewed. None identified.  Blood-thinner therapy: None at this time Active Infection(s): Reviewed. None identified. Chelsea Parker is afebrile  Site Confirmation: Chelsea Parker was asked to confirm the procedure and laterality before marking the site Procedure checklist: Completed Consent: Before the procedure and under the influence of no sedative(s), amnesic(s), or anxiolytics, the patient was informed of the treatment options, risks and possible complications. To fulfill our ethical and legal obligations, as recommended by the American Medical Association's Code of Ethics, I have informed the patient of my clinical impression; the nature and purpose of the treatment or procedure; the risks, benefits, and possible  complications of the intervention; the alternatives, including doing nothing; the risk(s)  and benefit(s) of the alternative treatment(s) or procedure(s); and the risk(s) and benefit(s) of doing nothing.  Chelsea Parker was provided with information about the general risks and possible complications associated with most interventional procedures. These include, but are not limited to: failure to achieve desired goals, infection, bleeding, organ or nerve damage, allergic reactions, paralysis, and/or death.  In addition, she was informed of those risks and possible complications associated to this particular procedure, which include, but are not limited to: damage to the implant; failure to decrease pain; local, systemic, or serious CNS infections, intraspinal abscess with possible cord compression and paralysis, or life-threatening such as meningitis; bleeding; organ damage; nerve injury or damage with subsequent sensory, motor, and/or autonomic system dysfunction, resulting in transient or permanent pain, numbness, and/or weakness of one or several areas of the body; allergic reactions, either minor or major life-threatening, such as anaphylactic or anaphylactoid reactions.  Furthermore, Chelsea Parker was informed of those risks and complications associated with the medications. These include, but are not limited to: allergic reactions (i.e.: anaphylactic or anaphylactoid reactions); endorphine suppression; bradycardia and/or hypotension; water retention and/or peripheral vascular relaxation leading to lower extremity edema and possible stasis ulcers; respiratory depression and/or shortness of breath; decreased metabolic rate leading to weight gain; swelling or edema; medication-induced neural toxicity; particulate matter embolism and blood vessel occlusion with resultant organ, and/or nervous system infarction; and/or intrathecal granuloma formation with possible spinal cord compression and permanent  paralysis.  Before refilling the pump Chelsea Parker was informed that some of the medications used in the devise may not be FDA approved for such use and therefore it constitutes an off-label use of the medications.  Finally, she was informed that Medicine is not an exact science; therefore, there is also the possibility of unforeseen or unpredictable risks and/or possible complications that may result in a catastrophic outcome. The patient indicated having understood very clearly. We have given the patient no guarantees and we have made no promises. Enough time was given to the patient to ask questions, all of which were answered to the patient's satisfaction. Ms. Shetterly has indicated that she wanted to continue with the procedure. Attestation: I, the ordering provider, attest that I have discussed with the patient the benefits, risks, side-effects, alternatives, likelihood of achieving goals, and potential problems during recovery for the procedure that I have provided informed consent. Date: 02/07/2017; Time: 11:51 AM  Pre-Procedure Preparation:  Monitoring: As per clinic protocol. Respiration, ETCO2, SpO2, BP, heart rate and rhythm monitor placed and checked for adequate function Safety Precautions: Patient was assessed for positional comfort and pressure points before starting the procedure. Time-out: I initiated and conducted the "Time-out" before starting the procedure, as per protocol. The patient was asked to participate by confirming the accuracy of the "Time Out" information. Verification of the correct person, site, and procedure were performed and confirmed by me, the nursing staff, and the patient. "Time-out" conducted as per Joint Commission's Universal Protocol (UP.01.01.01). "Time-out" Date & Time: 02/07/2017; 1206 hrs.  Description of Procedure Process:   Position: Supine Target Area: Central-port of intrathecal pump. Approach: Anterior, 90 degree angle approach. Area Prepped:  Entire Area around the pump implant. Prepping solution: ChloraPrep (2% chlorhexidine gluconate and 70% isopropyl alcohol) Safety Precautions: Aspiration looking for blood return was conducted prior to all injections. At no point did we inject any substances, as a needle was being advanced. No attempts were made at seeking any paresthesias. Safe injection practices and needle disposal techniques used. Medications properly checked  for expiration dates. SDV (single dose vial) medications used. Description of the Procedure: Protocol guidelines were followed. Two nurses trained to do implant refills were present during the entire procedure. The refill medication was checked by both healthcare providers as well as the patient. The patient was included in the "Time-out" to verify the medication. The patient was placed in position. The pump was identified. The area was prepped in the usual manner. The sterile template was positioned over the pump, making sure the side-port location matched that of the pump. Both, the pump and the template were held for stability. The needle provided in the Medtronic Kit was then introduced thru the center of the template and into the central port. The pump content was aspirated and discarded volume documented. The new medication was slowly infused into the pump, thru the filter, making sure to avoid overpressure of the device. The needle was then removed and the area cleansed, making sure to leave some of the prepping solution back to take advantage of its long term bactericidal properties. The pump was interrogated and programmed to reflect the correct medication, volume, and dosage. The program was printed and taken to the physician for approval. Once checked and signed by the physician, a copy was provided to the patient and another scanned into the EMR. Vitals:   02/07/17 1121  BP: 131/80  Pulse: 81  Resp: 16  Temp: 98.1 F (36.7 C)  SpO2: 96%  Weight: 106 lb (48.1 kg)   Height: 5' 5" (1.651 m)    Start Time: 1210 hrs. End Time: 1220 hrs. Materials & Medications: Medtronic Refill Kit Medication(s): Please see chart orders for details.  Imaging Guidance:  Type of Imaging Technique: None used Indication(s): N/A Exposure Time: No patient exposure Contrast: None used. Fluoroscopic Guidance: N/A Ultrasound Guidance: N/A Interpretation: N/A  Antibiotic Prophylaxis:  Indication(s): None identified Antibiotic given: None  Post-operative Assessment:  EBL: None Complications: No immediate post-treatment complications observed by team, or reported by patient. Note: The patient tolerated the entire procedure well. A repeat set of vitals were taken after the procedure and the patient was kept under observation following institutional policy, for this type of procedure. Post-procedural neurological assessment was performed, showing return to baseline, prior to discharge. The patient was provided with post-procedure discharge instructions, including a section on how to identify potential problems. Should any problems arise concerning this procedure, the patient was given instructions to immediately contact us, at any time, without hesitation. In any case, we plan to contact the patient by telephone for a follow-up status report regarding this interventional procedure. Comments:  No additional relevant information.  Plan of Care   Imaging Orders  No imaging studies ordered today    Procedure Orders     PUMP REFILL  Medications ordered for procedure: No orders of the defined types were placed in this encounter.  Medications administered: Elaijah Munoz had no medications administered during this visit.  See the medical record for exact dosing, route, and time of administration.  This SmartLink is deprecated. Use AVSMEDLIST instead to display the medication list for a patient. Disposition: Discharge home  Discharge Date & Time: 02/07/2017; 1235 hrs.    Physician-requested Follow-up: Return for Pump Refill (as per pump program). Future Appointments  Date Time Provider [place]  03/29/2017 11:45 AM Milinda Pointer, MD [facility] None   Primary Care Physician: Vira Blanco, MD Location: [facility] Out[facility] Facility Note by: Gaspar Cola, MD Date: 02/07/2017; Time: 1:06 PM  Disclaimer:  Medicine is not an Chief Strategy Officer. The only guarantee in medicine is that nothing is guaranteed. It is important to note that the decision to proceed with this intervention was based on the information collected from the patient. The Data and conclusions were drawn from the patient's questionnaire, the interview, and the physical examination. Because the information was provided in large part by the patient, it cannot be guaranteed that it has not been purposely or unconsciously manipulated. Every effort has been made to obtain as much relevant data as possible for this evaluation. It is important to note that the conclusions that lead to this procedure are derived in large part from the available data. Always take into account that the treatment will also be dependent on availability of resources and existing treatment guidelines, considered by other Pain Management Practitioners as being common knowledge and practice, at the time of the intervention. For Medico-Legal purposes, it is also important to point out that variation in procedural techniques and pharmacological choices are the acceptable norm. The indications, contraindications, technique, and results of the above procedure should only be interpreted and judged by a Board-Certified Interventional Pain Specialist with extensive familiarity and expertise in the same exact procedure and technique.

## 2017-02-07 NOTE — Patient Instructions (Signed)
Opioid Overdose Opioids are substances that relieve pain by binding to pain receptors in your brain and spinal cord. Opioids include illegal drugs, such as heroin, as well as prescription pain medicines.An opioid overdose happens when you take too much of an opioid substance. This can happen with any type of opioid, including:  Heroin.  Morphine.  Codeine.  Methadone.  Oxycodone.  Hydrocodone.  Fentanyl.  Hydromorphone.  Buprenorphine.  The effects of an overdose can be mild, dangerous, or even deadly. Opioid overdose is a medical emergency. What are the causes? This condition may be caused by:  Taking too much of an opioid by accident.  Taking too much of an opioid on purpose.  An error made by a health care provider who prescribes a medicine.  An error made by the pharmacist who fills the prescription order.  Using more than one substance that contains opioids at the same time.  Mixing an opioid with a substance that affects your heart, breathing, or blood pressure. These include alcohol, tranquilizers, sleeping pills, illegal drugs, and some over-the-counter medicines.  What increases the risk? This condition is more likely in:  Children. They may be attracted to colorful pills. Because of a child's small size, even a small amount of a drug can be dangerous.  Elderly people. They may be taking many different drugs. Elderly people may have difficulty reading labels or remembering when they last took their medicine.  People who take an opioid on a long-term basis.  People who use: ? Illegal drugs. ? Other substances, including alcohol, while using an opioid.  People who have: ? A history of drug or alcohol abuse. ? Certain mental health conditions.  People who take opioids that are not prescribed for them.  What are the signs or symptoms? Symptoms of this condition depend on the type of opioid and the amount that was taken. Common symptoms  include:  Sleepiness or difficulty waking from sleep.  Confusion.  Slurred speech.  Slowed breathing and a slow pulse.  Nausea and vomiting.  Abnormally small pupils.  Signs and symptoms that require emergency treatment include:  Cold, clammy, and pale skin.  Blue lips and fingernails.  Vomiting.  Gurgling sounds in the throat.  A pulse that is very slow or difficult to detect.  Breathing that is very slow, noisy, or difficult to detect.  Limp body.  Inability to respond to speech or be awakened from sleep (stupor).  How is this diagnosed? This condition is diagnosed based on your symptoms. It is important to tell your health care provider:  All of the opioidsthat you took.  When you took the opioids.  Whether you were drinking alcohol or using other substances.  Your health care provider will do a physical exam. This exam may include:  Checking and monitoring your heart rate and rhythm, your breathing rate and depth, your temperature, and your blood pressure (vital signs).  Checking for abnormally small pupils.  Measuring oxygen levels in your blood.  You may also have blood tests or urine tests. How is this treated? Supporting your vital signs and your breathing is the first step in treating an opioid overdose. Treatment may also include:  Giving fluids and minerals (electrolytes) through an IV tube.  Inserting a breathing tube (endotracheal tube) in your airway to help you breathe.  Giving oxygen.  Passing a tube through your nose and into your stomach (NG tube, or nasogastric tube) to wash out your stomach.  Giving medicines that: ? Increase your   blood pressure. ? Absorb any opioid that is in your digestive system. ? Reverse the effects of the opioid (naloxone).  Ongoing counseling and mental health support if you intentionally overdosed or used an illegal drug.  Follow these instructions at home:  Take over-the-counter and prescription  medicines only as told by your health care provider. Always ask your health care provider about possible side effects and interactions of any new medicine that you start taking.  Keep a list of all of the medicines that you take, including over-the-counter medicines. Bring this list with you to all of your medical visits.  Drink enough fluid to keep your urine clear or pale yellow.  Keep all follow-up visits as told by your health care provider. This is important. How is this prevented?  Get help if you are struggling with: ? Alcohol or drug use. ? Depression or another mental health problem.  Keep the phone number of your local poison control center near your phone or on your cell phone.  Store all medicines in safety containers that are out of the reach of children.  Read the drug inserts that come with your medicines.  Do not drink alcohol when taking opioids.  Do not use illegal drugs.  Do not take opioid medicines that are not prescribed for you. Contact a health care provider if:  Your symptoms return.  You develop new symptoms or side effects when you are taking medicines. Get help right away if:  You think that you or someone else may have taken too much of an opioid. The hotline of the National Poison Control Center is (800) 222-1222.  You or someone else is having symptoms of an opioid overdose.  You have serious thoughts about hurting yourself or others.  You have: ? Chest pain. ? Difficulty breathing. ? A loss of consciousness. Opioid overdose is an emergency. Do not wait to see if the symptoms will go away. Get medical help right away. Call your local emergency services (911 in the [place] Do not drive yourself to the hospital. This information is not intended to replace advice given to you by your health care provider. Make sure you discuss any questions you have with your health care provider. Document Released: 03/17/2004 Document Revised: 07/16/2015 Document  Reviewed: 07/24/2014 Elsevier Interactive Patient Education  2018 [employer]  

## 2017-02-07 NOTE — Progress Notes (Addendum)
Safety precautions to be maintained throughout the outpatient stay will include: orient to surroundings, keep bed in low position, maintain call bell within reach at all times, provide assistance with transfer out of bed and ambulation. Intrathecal pump refilledwithout difficulty under sterile technique witnessed by Burnett Harry RN.  5 ml wasted in sink witnessed by C. Government social research officer.

## 2017-02-08 ENCOUNTER — Telehealth: Payer: Self-pay | Admitting: *Deleted

## 2017-02-08 NOTE — Telephone Encounter (Signed)
Attempted to call for follow-up after pump refill. Message left.

## 2017-02-17 DIAGNOSIS — C4490 Unspecified malignant neoplasm of skin, unspecified: Secondary | ICD-10-CM | POA: Insufficient documentation

## 2017-03-21 ENCOUNTER — Other Ambulatory Visit: Payer: Self-pay

## 2017-03-21 MED ORDER — PAIN MANAGEMENT IT PUMP REFILL: 1.0000 | Freq: Once | INTRATHECAL | 0 refills | Status: AC

## 2017-03-22 ENCOUNTER — Other Ambulatory Visit: Payer: Self-pay

## 2017-03-22 MED ORDER — PAIN MANAGEMENT IT PUMP REFILL: 1.0000 | Freq: Once | INTRATHECAL | 0 refills | Status: AC

## 2017-03-26 NOTE — Progress Notes (Addendum)
Patient's Name: Chelsea Parker  MRN: [id number]  Referring Provider: Ray Church D*  DOB: 1951-01-05  PCP: Vira Blanco, MD  DOS: 03/29/2017  Note by: Gaspar Cola, MD  Service setting: Ambulatory outpatient  Specialty: Interventional Pain Management  Patient type: Established  Location: [facility] (AMB) Pain Management Facility  Visit type: Interventional Procedure   Primary Reason for Visit: Interventional Pain Management Treatment. CC: Neck Pain (worse on the left)  Procedure:  Intrathecal Drug Delivery System (IDDS):  Type: Reservoir Refill [phone]) No rate change Region: Gluteal Laterality: Left  Type of Pump: Medtronic Synchromed II (MRI-compatible) Delivery Route: Intrathecal Type of Pain Treated: Neuropathic/Nociceptive Primary Medication Class: Opioid/opiate  Medication, Concentration, Infusion Program, & Delivery Rate: Please see scanned programming printout.   Indications: 1. Chronic pain syndrome   2. Chronic low back pain (Primary Area of Pain) (Bilateral) (L>R)   3. Chronic lower extremity pain (Secondary Area of Pain) (Left)   4. Lumbar facet syndrome   5. Presence of implanted infusion pump   6. Pharmacologic therapy    Pain Assessment: Self-Reported Pain Score: 7 /10             Reported level is compatible with observation.        Intrathecal Pump Therapy Assessment  Manufacturer: Medtronic Synchromed II Type: Programmable Volume: 20 mL reservoir MRI compatibility: Yes   Drug content:  Primary Medication Class: Opioid Primary Medication:PF-Hydromorphone (Dilaudid)7.5 mg/ml Secondary Medication:PF-Baclofen400 mcg/ml Other Medication: None   Programming:  Type: Simple continuous. See pump readout for details.   Changes:  Medication Change: None at this point Rate Change: No change in rate  Reported side-effects or adverse reactions: None reported  Effectiveness: Described as relatively effective, allowing for increase in  activities of daily living (ADL) Clinically meaningful improvement in function (CMIF): Sustained CMIF goals met  Plan: Pump refill today  Pre-op Assessment:  Chelsea Parker is a 67 y.o. (year old), female patient, seen today for interventional treatment. She  has a past surgical history that includes Abdominal hysterectomy; Knee surgery (Right); and IT pump implant (2016). Chelsea Parker has a current medication list which includes the following prescription(s): amlodipine, apixaban, atorvastatin, cetirizine, docusate sodium, fluticasone, metoprolol succinate, and trazodone. Her primarily concern today is the Neck Pain (worse on the left)  Initial Vital Signs:  Pulse Rate: (!) 115 Temp: 98.9 F (37.2 C) Resp: 18 BP: 122/67 SpO2: 100 %  BMI: Estimated body mass index is 17.64 kg/m as calculated from the following:   Height as of this encounter: '5\' 5"'$  (1.651 m).   Weight as of this encounter: 106 lb (48.1 kg).  Risk Assessment: Allergies: Reviewed. She has No Known Allergies.  Allergy Precautions: None required Coagulopathies: Reviewed. None identified.  Blood-thinner therapy: None at this time Active Infection(s): Reviewed. None identified. Chelsea Parker is afebrile  Site Confirmation: Chelsea Parker was asked to confirm the procedure and laterality before marking the site Procedure checklist: Completed Consent: Before the procedure and under the influence of no sedative(s), amnesic(s), or anxiolytics, the patient was informed of the treatment options, risks and possible complications. To fulfill our ethical and legal obligations, as recommended by the American Medical Association's Code of Ethics, I have informed the patient of my clinical impression; the nature and purpose of the treatment or procedure; the risks, benefits, and possible complications of the intervention; the alternatives, including doing nothing; the risk(s) and benefit(s) of the alternative treatment(s) or procedure(s); and the  risk(s) and benefit(s) of doing nothing.  Chelsea Parker  was provided with information about the general risks and possible complications associated with most interventional procedures. These include, but are not limited to: failure to achieve desired goals, infection, bleeding, organ or nerve damage, allergic reactions, paralysis, and/or death.  In addition, she was informed of those risks and possible complications associated to this particular procedure, which include, but are not limited to: damage to the implant; failure to decrease pain; local, systemic, or serious CNS infections, intraspinal abscess with possible cord compression and paralysis, or life-threatening such as meningitis; bleeding; organ damage; nerve injury or damage with subsequent sensory, motor, and/or autonomic system dysfunction, resulting in transient or permanent pain, numbness, and/or weakness of one or several areas of the body; allergic reactions, either minor or major life-threatening, such as anaphylactic or anaphylactoid reactions.  Furthermore, Chelsea Parker was informed of those risks and complications associated with the medications. These include, but are not limited to: allergic reactions (i.e.: anaphylactic or anaphylactoid reactions); endorphine suppression; bradycardia and/or hypotension; water retention and/or peripheral vascular relaxation leading to lower extremity edema and possible stasis ulcers; respiratory depression and/or shortness of breath; decreased metabolic rate leading to weight gain; swelling or edema; medication-induced neural toxicity; particulate matter embolism and blood vessel occlusion with resultant organ, and/or nervous system infarction; and/or intrathecal granuloma formation with possible spinal cord compression and permanent paralysis.  Before refilling the pump Chelsea Parker was informed that some of the medications used in the devise may not be FDA approved for such use and therefore it  constitutes an off-label use of the medications.  Finally, she was informed that Medicine is not an exact science; therefore, there is also the possibility of unforeseen or unpredictable risks and/or possible complications that may result in a catastrophic outcome. The patient indicated having understood very clearly. We have given the patient no guarantees and we have made no promises. Enough time was given to the patient to ask questions, all of which were answered to the patient's satisfaction. Chelsea Parker has indicated that she wanted to continue with the procedure. Attestation: I, the ordering provider, attest that I have discussed with the patient the benefits, risks, side-effects, alternatives, likelihood of achieving goals, and potential problems during recovery for the procedure that I have provided informed consent. Date: 03/29/2017; Time: 8:37 PM  Pre-Procedure Preparation:  Monitoring: As per clinic protocol. Respiration, ETCO2, SpO2, BP, heart rate and rhythm monitor placed and checked for adequate function Safety Precautions: Patient was assessed for positional comfort and pressure points before starting the procedure. Time-out: I initiated and conducted the "Time-out" before starting the procedure, as per protocol. The patient was asked to participate by confirming the accuracy of the "Time Out" information. Verification of the correct person, site, and procedure were performed and confirmed by me, the nursing staff, and the patient. "Time-out" conducted as per Joint Commission's Universal Protocol (UP.01.01.01). "Time-out" Date & Time: 03/29/2017; 1304 hrs.  Description of Procedure Process:   Position: Supine Target Area: Central-port of intrathecal pump. Approach: Anterior, 90 degree angle approach. Area Prepped: Entire Area around the pump implant. Prepping solution: ChloraPrep (2% chlorhexidine gluconate and 70% isopropyl alcohol) Safety Precautions: Aspiration looking for blood  return was conducted prior to all injections. At no point did we inject any substances, as a needle was being advanced. No attempts were made at seeking any paresthesias. Safe injection practices and needle disposal techniques used. Medications properly checked for expiration dates. SDV (single dose vial) medications used. Description of the Procedure: Protocol guidelines were followed. Two nurses  trained to do implant refills were present during the entire procedure. The refill medication was checked by both healthcare providers as well as the patient. The patient was included in the "Time-out" to verify the medication. The patient was placed in position. The pump was identified. The area was prepped in the usual manner. The sterile template was positioned over the pump, making sure the side-port location matched that of the pump. Both, the pump and the template were held for stability. The needle provided in the Medtronic Kit was then introduced thru the center of the template and into the central port. The pump content was aspirated and discarded volume documented. The new medication was slowly infused into the pump, thru the filter, making sure to avoid overpressure of the device. The needle was then removed and the area cleansed, making sure to leave some of the prepping solution back to take advantage of its long term bactericidal properties. The pump was interrogated and programmed to reflect the correct medication, volume, and dosage. The program was printed and taken to the physician for approval. Once checked and signed by the physician, a copy was provided to the patient and another scanned into the EMR. Vitals:   03/29/17 1234  BP: 122/67  Pulse: (!) 115  Resp: 18  Temp: 98.9 F (37.2 C)  TempSrc: Oral  SpO2: 100%  Weight: 106 lb (48.1 kg)  Height: '5\' 5"'$  (1.651 m)    Start Time: 1313 hrs. End Time: 1319 hrs. Materials & Medications: Medtronic Refill Kit Medication(s): Please see chart  orders for details.  Imaging Guidance:  Type of Imaging Technique: None used Indication(s): N/A Exposure Time: No patient exposure Contrast: None used. Fluoroscopic Guidance: N/A Ultrasound Guidance: N/A Interpretation: N/A  Antibiotic Prophylaxis:  Indication(s): None identified Antibiotic given: None  Post-operative Assessment:  Post-procedure Vital Signs:  Pulse Rate: (!) 115 Temp: 98.9 F (37.2 C) Resp: 18 BP: 122/67 SpO2: 100 %  EBL: None  Complications: No immediate post-treatment complications observed by team, or reported by patient.  Note: The patient tolerated the entire procedure well. A repeat set of vitals were taken after the procedure and the patient was kept under observation following institutional policy, for this type of procedure. Post-procedural neurological assessment was performed, showing return to baseline, prior to discharge. The patient was provided with post-procedure discharge instructions, including a section on how to identify potential problems. Should any problems arise concerning this procedure, the patient was given instructions to immediately contact us, at any time, without hesitation. In any case, we plan to contact the patient by telephone for a follow-up status report regarding this interventional procedure.  Comments:  No additional relevant information.  Plan of Care   Imaging Orders  No imaging studies ordered today   Procedure Orders    No procedure(s) ordered today    Medications ordered for procedure: No orders of the defined types were placed in this encounter.  Medications administered: Chelsea Parker had no medications administered during this visit.  See the medical record for exact dosing, route, and time of administration.  New Prescriptions   No medications on file   Disposition: Discharge home  Discharge Date & Time: 03/29/2017; 1340 hrs.   Physician-requested Follow-up: Return for Pump Refill (as per pump  program).  No future appointments. Primary Care Physician: Vira Blanco, MD Location: [facility] Note by: Gaspar Cola, MD Date: 03/29/2017; Time: 1:43 PM  Disclaimer:  Medicine is not an Chief Strategy Officer. The only  guarantee in medicine is that nothing is guaranteed. It is important to note that the decision to proceed with this intervention was based on the information collected from the patient. The Data and conclusions were drawn from the patient's questionnaire, the interview, and the physical examination. Because the information was provided in large part by the patient, it cannot be guaranteed that it has not been purposely or unconsciously manipulated. Every effort has been made to obtain as much relevant data as possible for this evaluation. It is important to note that the conclusions that lead to this procedure are derived in large part from the available data. Always take into account that the treatment will also be dependent on availability of resources and existing treatment guidelines, considered by other Pain Management Practitioners as being common knowledge and practice, at the time of the intervention. For Medico-Legal purposes, it is also important to point out that variation in procedural techniques and pharmacological choices are the acceptable norm. The indications, contraindications, technique, and results of the above procedure should only be interpreted and judged by a Board-Certified Interventional Pain Specialist with extensive familiarity and expertise in the same exact procedure and technique.

## 2017-03-29 ENCOUNTER — Encounter: Payer: Self-pay | Admitting: Pain Medicine

## 2017-03-29 ENCOUNTER — Ambulatory Visit: Payer: Medicare Other | Attending: Pain Medicine | Admitting: Pain Medicine

## 2017-03-29 ENCOUNTER — Other Ambulatory Visit: Payer: Self-pay

## 2017-03-29 VITALS — BP 122/67 | HR 115 | Temp 98.9°F | Resp 18 | Ht 65.0 in | Wt 106.0 lb

## 2017-03-29 DIAGNOSIS — M47816 Spondylosis without myelopathy or radiculopathy, lumbar region: Secondary | ICD-10-CM

## 2017-03-29 DIAGNOSIS — G8929 Other chronic pain: Secondary | ICD-10-CM

## 2017-03-29 DIAGNOSIS — Z9689 Presence of other specified functional implants: Secondary | ICD-10-CM | POA: Insufficient documentation

## 2017-03-29 DIAGNOSIS — Z95828 Presence of other vascular implants and grafts: Secondary | ICD-10-CM

## 2017-03-29 DIAGNOSIS — M545 Low back pain: Secondary | ICD-10-CM | POA: Diagnosis not present

## 2017-03-29 DIAGNOSIS — M79605 Pain in left leg: Secondary | ICD-10-CM | POA: Insufficient documentation

## 2017-03-29 DIAGNOSIS — Z79899 Other long term (current) drug therapy: Secondary | ICD-10-CM

## 2017-03-29 DIAGNOSIS — M542 Cervicalgia: Secondary | ICD-10-CM | POA: Insufficient documentation

## 2017-03-29 DIAGNOSIS — M5442 Lumbago with sciatica, left side: Secondary | ICD-10-CM

## 2017-03-29 DIAGNOSIS — G894 Chronic pain syndrome: Secondary | ICD-10-CM

## 2017-03-30 ENCOUNTER — Telehealth: Payer: Self-pay

## 2017-03-30 NOTE — Telephone Encounter (Signed)
Post procedure phone call.  Patient states she is doing ok.  No problems with pump,

## 2017-04-03 MED FILL — Medication: INTRATHECAL | Qty: 1 | Status: AC

## 2017-04-05 DIAGNOSIS — C4441 Basal cell carcinoma of skin of scalp and neck: Secondary | ICD-10-CM | POA: Insufficient documentation

## 2017-05-08 ENCOUNTER — Other Ambulatory Visit: Payer: Self-pay

## 2017-05-08 MED ORDER — PAIN MANAGEMENT IT PUMP REFILL: 1.0000 | Freq: Once | INTRATHECAL | 0 refills | Status: AC

## 2017-05-18 ENCOUNTER — Other Ambulatory Visit: Payer: Self-pay

## 2017-05-18 ENCOUNTER — Encounter: Payer: Self-pay | Admitting: Pain Medicine

## 2017-05-18 ENCOUNTER — Ambulatory Visit: Payer: Medicare Other | Attending: Pain Medicine | Admitting: Pain Medicine

## 2017-05-18 VITALS — BP 114/73 | HR 48 | Temp 98.0°F | Resp 16 | Ht 65.0 in | Wt 103.0 lb

## 2017-05-18 DIAGNOSIS — R252 Cramp and spasm: Secondary | ICD-10-CM | POA: Diagnosis not present

## 2017-05-18 DIAGNOSIS — G894 Chronic pain syndrome: Secondary | ICD-10-CM | POA: Insufficient documentation

## 2017-05-18 DIAGNOSIS — M5442 Lumbago with sciatica, left side: Secondary | ICD-10-CM

## 2017-05-18 DIAGNOSIS — M79605 Pain in left leg: Secondary | ICD-10-CM | POA: Diagnosis not present

## 2017-05-18 DIAGNOSIS — M542 Cervicalgia: Secondary | ICD-10-CM | POA: Diagnosis not present

## 2017-05-18 DIAGNOSIS — Z978 Presence of other specified devices: Secondary | ICD-10-CM | POA: Diagnosis not present

## 2017-05-18 DIAGNOSIS — G8929 Other chronic pain: Secondary | ICD-10-CM

## 2017-05-18 DIAGNOSIS — M545 Low back pain: Secondary | ICD-10-CM | POA: Insufficient documentation

## 2017-05-18 DIAGNOSIS — Z95828 Presence of other vascular implants and grafts: Secondary | ICD-10-CM

## 2017-05-18 NOTE — Progress Notes (Signed)
Safety precautions to be maintained throughout the outpatient stay will include: orient to surroundings, keep bed in low position, maintain call bell within reach at all times, provide assistance with transfer out of bed and ambulation.  

## 2017-05-18 NOTE — Patient Instructions (Addendum)
Opioid Overdose Opioids are substances that relieve pain by binding to pain receptors in your brain and spinal cord. Opioids include illegal drugs, such as heroin, as well as prescription pain medicines.An opioid overdose happens when you take too much of an opioid substance. This can happen with any type of opioid, including:  Heroin.  Morphine.  Codeine.  Methadone.  Oxycodone.  Hydrocodone.  Fentanyl.  Hydromorphone.  Buprenorphine.  The effects of an overdose can be mild, dangerous, or even deadly. Opioid overdose is a medical emergency. What are the causes? This condition may be caused by:  Taking too much of an opioid by accident.  Taking too much of an opioid on purpose.  An error made by a health care provider who prescribes a medicine.  An error made by the pharmacist who fills the prescription order.  Using more than one substance that contains opioids at the same time.  Mixing an opioid with a substance that affects your heart, breathing, or blood pressure. These include alcohol, tranquilizers, sleeping pills, illegal drugs, and some over-the-counter medicines.  What increases the risk? This condition is more likely in:  Children. They may be attracted to colorful pills. Because of a child's small size, even a small amount of a drug can be dangerous.  Elderly people. They may be taking many different drugs. Elderly people may have difficulty reading labels or remembering when they last took their medicine.  People who take an opioid on a long-term basis.  People who use: ? Illegal drugs. ? Other substances, including alcohol, while using an opioid.  People who have: ? A history of drug or alcohol abuse. ? Certain mental health conditions.  People who take opioids that are not prescribed for them.  What are the signs or symptoms? Symptoms of this condition depend on the type of opioid and the amount that was taken. Common symptoms  include:  Sleepiness or difficulty waking from sleep.  Confusion.  Slurred speech.  Slowed breathing and a slow pulse.  Nausea and vomiting.  Abnormally small pupils.  Signs and symptoms that require emergency treatment include:  Cold, clammy, and pale skin.  Blue lips and fingernails.  Vomiting.  Gurgling sounds in the throat.  A pulse that is very slow or difficult to detect.  Breathing that is very slow, noisy, or difficult to detect.  Limp body.  Inability to respond to speech or be awakened from sleep (stupor).  How is this diagnosed? This condition is diagnosed based on your symptoms. It is important to tell your health care provider:  All of the opioidsthat you took.  When you took the opioids.  Whether you were drinking alcohol or using other substances.  Your health care provider will do a physical exam. This exam may include:  Checking and monitoring your heart rate and rhythm, your breathing rate and depth, your temperature, and your blood pressure (vital signs).  Checking for abnormally small pupils.  Measuring oxygen levels in your blood.  You may also have blood tests or urine tests. How is this treated? Supporting your vital signs and your breathing is the first step in treating an opioid overdose. Treatment may also include:  Giving fluids and minerals (electrolytes) through an IV tube.  Inserting a breathing tube (endotracheal tube) in your airway to help you breathe.  Giving oxygen.  Passing a tube through your nose and into your stomach (NG tube, or nasogastric tube) to wash out your stomach.  Giving medicines that: ? Increase your   blood pressure. ? Absorb any opioid that is in your digestive system. ? Reverse the effects of the opioid (naloxone).  Ongoing counseling and mental health support if you intentionally overdosed or used an illegal drug.  Follow these instructions at home:  Take over-the-counter and prescription  medicines only as told by your health care provider. Always ask your health care provider about possible side effects and interactions of any new medicine that you start taking.  Keep a list of all of the medicines that you take, including over-the-counter medicines. Bring this list with you to all of your medical visits.  Drink enough fluid to keep your urine clear or pale yellow.  Keep all follow-up visits as told by your health care provider. This is important. How is this prevented?  Get help if you are struggling with: ? Alcohol or drug use. ? Depression or another mental health problem.  Keep the phone number of your local poison control center near your phone or on your cell phone.  Store all medicines in safety containers that are out of the reach of children.  Read the drug inserts that come with your medicines.  Do not drink alcohol when taking opioids.  Do not use illegal drugs.  Do not take opioid medicines that are not prescribed for you. Contact a health care provider if:  Your symptoms return.  You develop new symptoms or side effects when you are taking medicines. Get help right away if:  You think that you or someone else may have taken too much of an opioid. The hotline [facility] is [phone].  You or someone else is having symptoms of an opioid overdose.  You have serious thoughts about hurting yourself or others.  You have: ? Chest pain. ? Difficulty breathing. ? A loss of consciousness. Opioid overdose is an emergency. Do not wait to see if the symptoms will go away. Get medical help right away. Call your local emergency services (911 in the [place] Do not drive yourself to the hospital. This information is not intended to replace advice given to you by your health care provider. Make sure you discuss any questions you have with your health care provider. Document Released: 03/17/2004 Document Revised: 07/16/2015 Document  Reviewed: 07/24/2014 Elsevier Interactive Patient Education  2018 [employer].  ____________________________________________________________________________________________  Pain Scale  Introduction: The pain score used by this practice is the Verbal Numerical Rating Scale (VNRS-11). This is an 11-point scale. It is for adults and children 10 years or older. There are significant differences in how the pain score is reported, used, and applied. Forget everything you learned in the past and learn this scoring system.  General Information: The scale should reflect your current level of pain. Unless you are specifically asked for the level of your worst pain, or your average pain. If you are asked for one of these two, then it should be understood that it is over the past 24 hours.  Basic Activities of Daily Living (ADL): Personal hygiene, dressing, eating, transferring, and using restroom.  Instructions: Most patients tend to report their level of pain as a combination of two factors, their physical pain and their psychosocial pain. This last one is also known as "suffering" and it is reflection of how physical pain affects you socially and psychologically. From now on, report them separately. From this point on, when asked to report your pain level, report only your physical pain. Use the following table for reference.  Pain Clinic Pain Levels (0-5/10)  Pain Level Score  Description  No Pain 0   Mild pain 1 Nagging, annoying, but does not interfere with basic activities of daily living (ADL). Patients are able to eat, bathe, get dressed, toileting (being able to get on and off the toilet and perform personal hygiene functions), transfer (move in and out of bed or a chair without assistance), and maintain continence (able to control bladder and bowel functions). Blood pressure and heart rate are unaffected. A normal heart rate for a healthy adult ranges from 60 to 100 bpm (beats per minute).    Mild to moderate pain 2 Noticeable and distracting. Impossible to hide from other people. More frequent flare-ups. Still possible to adapt and function close to normal. It can be very annoying and may have occasional stronger flare-ups. With discipline, patients may get used to it and adapt.   Moderate pain 3 Interferes significantly with activities of daily living (ADL). It becomes difficult to feed, bathe, get dressed, get on and off the toilet or to perform personal hygiene functions. Difficult to get in and out of bed or a chair without assistance. Very distracting. With effort, it can be ignored when deeply involved in activities.   Moderately severe pain 4 Impossible to ignore for more than a few minutes. With effort, patients may still be able to manage work or participate in some social activities. Very difficult to concentrate. Signs of autonomic nervous system discharge are evident: dilated pupils (mydriasis); mild sweating (diaphoresis); sleep interference. Heart rate becomes elevated (>115 bpm). Diastolic blood pressure (lower number) rises above 100 mmHg. Patients find relief in laying down and not moving.   Severe pain 5 Intense and extremely unpleasant. Associated with frowning face and frequent crying. Pain overwhelms the senses.  Ability to do any activity or maintain social relationships becomes significantly limited. Conversation becomes difficult. Pacing back and forth is common, as getting into a comfortable position is nearly impossible. Pain wakes you up from deep sleep. Physical signs will be obvious: pupillary dilation; increased sweating; goosebumps; brisk reflexes; cold, clammy hands and feet; nausea, vomiting or dry heaves; loss of appetite; significant sleep disturbance with inability to fall asleep or to remain asleep. When persistent, significant weight loss is observed due to the complete loss of appetite and sleep deprivation.  Blood pressure and heart rate becomes  significantly elevated. Caution: If elevated blood pressure triggers a pounding headache associated with blurred vision, then the patient should immediately seek attention at an urgent or emergency care unit, as these may be signs of an impending stroke.    Emergency Department Pain Levels (6-10/10)  Emergency Room Pain 6 Severely limiting. Requires emergency care and should not be seen or managed at an outpatient pain management facility. Communication becomes difficult and requires great effort. Assistance to reach the emergency department may be required. Facial flushing and profuse sweating along with potentially dangerous increases in heart rate and blood pressure will be evident.   Distressing pain 7 Self-care is very difficult. Assistance is required to transport, or use restroom. Assistance to reach the emergency department will be required. Tasks requiring coordination, such as bathing and getting dressed become very difficult.   Disabling pain 8 Self-care is no longer possible. At this level, pain is disabling. The individual is unable to do even the most "basic" activities such as walking, eating, bathing, dressing, transferring to a bed, or toileting. Fine motor skills are lost. It is difficult to think clearly.  Incapacitating pain 9 Pain becomes incapacitating. Thought processing is no longer possible. Difficult to remember your own name. Control of movement and coordination are lost.   The worst pain imaginable 10 At this level, most patients pass out from pain. When this level is reached, collapse of the autonomic nervous system occurs, leading to a sudden drop in blood pressure and heart rate. This in turn results in a temporary and dramatic drop in blood flow to the brain, leading to a loss of consciousness. Fainting is one of the body's self defense mechanisms. Passing out puts the brain in a calmed state and causes it to shut down for a while, in order to begin the healing  process.    Summary: 1. Refer to this scale when providing [place] with your pain level. 2. Be accurate and careful when reporting your pain level. This will help with your care. 3. Over-reporting your pain level will lead to loss of credibility. 4. Even a level of 1/10 means that there is pain and will be treated at our facility. 5. High, inaccurate reporting will be documented as "Symptom Exaggeration", leading to loss of credibility and suspicions of possible secondary gains such as obtaining more narcotics, or wanting to appear disabled, for fraudulent reasons. 6. Only pain levels of 5 or below will be seen at our facility. 7. Pain levels of 6 and above will be sent to the Emergency Department and the appointment cancelled. ____________________________________________________________________________________________

## 2017-05-18 NOTE — Progress Notes (Signed)
Patient's Name: Chelsea Parker  MRN: [id number]  Referring Provider: Ray Church D*  DOB: Feb 01, 1951  PCP: Vira Blanco, MD  DOS: 05/18/2017  Note by: Gaspar Cola, MD  Service setting: Ambulatory outpatient  Specialty: Interventional Pain Management  Patient type: Established  Location: [facility] (AMB) Pain Management Facility  Visit type: Interventional Procedure   Primary Reason for Visit: Interventional Pain Management Treatment. CC: Neck Pain  Procedure:  Intrathecal Drug Delivery System (IDDS):  Type: Reservoir Refill [phone]) No rate change Region: Gluteal Laterality: Left  Type of Pump: Medtronic Synchromed II (MRI-compatible) Delivery Route: Intrathecal Type of Pain Treated: Neuropathic/Nociceptive Primary Medication Class: Opioid/opiate  Medication, Concentration, Infusion Program, & Delivery Rate: Please see scanned programming printout.   Indications: 1. Chronic pain syndrome   2. Chronic low back pain (Primary Area of Pain) (Bilateral) (L>R)   3. Chronic lower extremity pain (Secondary Area of Pain) (Left)   4. Spasticity   5. Presence of implanted infusion pump    Pain Assessment: Self-Reported Pain Score: 7 /10 Clinically the patient looks like a 2/10 Reported level is inconsistent with clinical observations. Information on the proper use of the pain score provided to the patient today.  Intrathecal Pump Therapy Assessment  Manufacturer: Medtronic Synchromed II Type: Programmable Volume: 20 mL reservoir MRI compatibility: Yes   Drug content:  Primary Medication Class:Opioid Primary Medication:PF-Hydromorphone (Dilaudid)7.5 mg/ml Secondary Medication:PF-Baclofen400 mcg/ml Other Medication: None   Programming:  Type: Simple continuous. See pump readout for details.   Changes:  Medication Change: None at this point Rate Change: No change in rate  Reported side-effects or adverse reactions: None reported  Effectiveness:  Described as relatively effective, allowing for increase in activities of daily living (ADL) Clinically meaningful improvement in function (CMIF): Sustained CMIF goals met  Plan: Pump refill today  Pre-op Assessment:  Chelsea Parker is a 67 y.o. (year old), female patient, seen today for interventional treatment. She  has a past surgical history that includes Abdominal hysterectomy; Knee surgery (Right); and IT pump implant (2016). Chelsea Parker has a current medication list which includes the following prescription(s): amlodipine, apixaban, atorvastatin, buspirone, cetirizine, docusate sodium, flavoxate, fluticasone, metoprolol succinate, ondansetron, and trazodone. Her primarily concern today is the Neck Pain  Initial Vital Signs:  Pulse Rate: (!) 48 Temp: 98 F (36.7 C) Resp: 16 BP: 114/73 SpO2: 98 %  BMI: Estimated body mass index is 17.14 kg/m as calculated from the following:   Height as of this encounter: 5' 5"  (1.651 m).   Weight as of this encounter: 103 lb (46.7 kg).  Risk Assessment: Allergies: Reviewed. She has No Known Allergies.  Allergy Precautions: None required Coagulopathies: Reviewed. None identified.  Blood-thinner therapy: None at this time Active Infection(s): Reviewed. None identified. Chelsea Parker is afebrile  Site Confirmation: Chelsea Parker was asked to confirm the procedure and laterality before marking the site Procedure checklist: Completed Consent: Before the procedure and under the influence of no sedative(s), amnesic(s), or anxiolytics, the patient was informed of the treatment options, risks and possible complications. To fulfill our ethical and legal obligations, as recommended by the American Medical Association's Code of Ethics, I have informed the patient of my clinical impression; the nature and purpose of the treatment or procedure; the risks, benefits, and possible complications of the intervention; the alternatives, including doing nothing; the risk(s)  and benefit(s) of the alternative treatment(s) or procedure(s); and the risk(s) and benefit(s) of doing nothing.  Chelsea Parker was provided with information about the general risks and  possible complications associated with most interventional procedures. These include, but are not limited to: failure to achieve desired goals, infection, bleeding, organ or nerve damage, allergic reactions, paralysis, and/or death.  In addition, she was informed of those risks and possible complications associated to this particular procedure, which include, but are not limited to: damage to the implant; failure to decrease pain; local, systemic, or serious CNS infections, intraspinal abscess with possible cord compression and paralysis, or life-threatening such as meningitis; bleeding; organ damage; nerve injury or damage with subsequent sensory, motor, and/or autonomic system dysfunction, resulting in transient or permanent pain, numbness, and/or weakness of one or several areas of the body; allergic reactions, either minor or major life-threatening, such as anaphylactic or anaphylactoid reactions.  Furthermore, Chelsea Parker was informed of those risks and complications associated with the medications. These include, but are not limited to: allergic reactions (i.e.: anaphylactic or anaphylactoid reactions); endorphine suppression; bradycardia and/or hypotension; water retention and/or peripheral vascular relaxation leading to lower extremity edema and possible stasis ulcers; respiratory depression and/or shortness of breath; decreased metabolic rate leading to weight gain; swelling or edema; medication-induced neural toxicity; particulate matter embolism and blood vessel occlusion with resultant organ, and/or nervous system infarction; and/or intrathecal granuloma formation with possible spinal cord compression and permanent paralysis.  Before refilling the pump Chelsea Parker was informed that some of the medications used in  the devise may not be FDA approved for such use and therefore it constitutes an off-label use of the medications.  Finally, she was informed that Medicine is not an exact science; therefore, there is also the possibility of unforeseen or unpredictable risks and/or possible complications that may result in a catastrophic outcome. The patient indicated having understood very clearly. We have given the patient no guarantees and we have made no promises. Enough time was given to the patient to ask questions, all of which were answered to the patient's satisfaction. Ms. Bowermaster has indicated that she wanted to continue with the procedure. Attestation: I, the ordering provider, attest that I have discussed with the patient the benefits, risks, side-effects, alternatives, likelihood of achieving goals, and potential problems during recovery for the procedure that I have provided informed consent. Date  Time: 05/18/2017 11:17 AM  Pre-Procedure Preparation:  Monitoring: As per clinic protocol. Respiration, ETCO2, SpO2, BP, heart rate and rhythm monitor placed and checked for adequate function Safety Precautions: Patient was assessed for positional comfort and pressure points before starting the procedure. Time-out: I initiated and conducted the "Time-out" before starting the procedure, as per protocol. The patient was asked to participate by confirming the accuracy of the "Time Out" information. Verification of the correct person, site, and procedure were performed and confirmed by me, the nursing staff, and the patient. "Time-out" conducted as per Joint Commission's Universal Protocol (UP.01.01.01). Time: 1141  Description of Procedure Process:   Position: Supine Target Area: Central-port of intrathecal pump. Approach: Anterior, 90 degree angle approach. Area Prepped: Entire Area around the pump implant. Prepping solution: ChloraPrep (2% chlorhexidine gluconate and 70% isopropyl alcohol) Safety  Precautions: Aspiration looking for blood return was conducted prior to all injections. At no point did we inject any substances, as a needle was being advanced. No attempts were made at seeking any paresthesias. Safe injection practices and needle disposal techniques used. Medications properly checked for expiration dates. SDV (single dose vial) medications used. Description of the Procedure: Protocol guidelines were followed. Two nurses trained to do implant refills were present during the entire procedure. The refill  medication was checked by both healthcare providers as well as the patient. The patient was included in the "Time-out" to verify the medication. The patient was placed in position. The pump was identified. The area was prepped in the usual manner. The sterile template was positioned over the pump, making sure the side-port location matched that of the pump. Both, the pump and the template were held for stability. The needle provided in the Medtronic Kit was then introduced thru the center of the template and into the central port. The pump content was aspirated and discarded volume documented. The new medication was slowly infused into the pump, thru the filter, making sure to avoid overpressure of the device. The needle was then removed and the area cleansed, making sure to leave some of the prepping solution back to take advantage of its long term bactericidal properties. The pump was interrogated and programmed to reflect the correct medication, volume, and dosage. The program was printed and taken to the physician for approval. Once checked and signed by the physician, a copy was provided to the patient and another scanned into the EMR. Vitals:   05/18/17 1116 05/18/17 1117  BP:  114/73  Pulse:  (!) 48  Resp:  16  Temp:  98 F (36.7 C)  SpO2:  98%  Weight: 103 lb (46.7 kg)   Height: 5' 5"  (1.651 m)     Start Time: 1141 hrs. End Time:   hrs. Materials & Medications: Medtronic Refill  Kit Medication(s): Please see chart orders for details.  Imaging Guidance:  Type of Imaging Technique: None used Indication(s): N/A Exposure Time: No patient exposure Contrast: None used. Fluoroscopic Guidance: N/A Ultrasound Guidance: N/A Interpretation: N/A  Antibiotic Prophylaxis:   Anti-infectives (From admission, onward)   None     Indication(s): None identified  Post-operative Assessment:  Post-procedure Vital Signs:  Pulse Rate: (!) 48 Temp: 98 F (36.7 C) Resp: 16 BP: 114/73 SpO2: 98 %  EBL: None  Complications: No immediate post-treatment complications observed by team, or reported by patient.  Note: The patient tolerated the entire procedure well. A repeat set of vitals were taken after the procedure and the patient was kept under observation following institutional policy, for this type of procedure. Post-procedural neurological assessment was performed, showing return to baseline, prior to discharge. The patient was provided with post-procedure discharge instructions, including a section on how to identify potential problems. Should any problems arise concerning this procedure, the patient was given instructions to immediately contact us, at any time, without hesitation. In any case, we plan to contact the patient by telephone for a follow-up status report regarding this interventional procedure.  Comments:  No additional relevant information.  Plan of Care   Imaging Orders  No imaging studies ordered today    Procedure Orders     PUMP REFILL     PUMP REFILL  Medications ordered for procedure: No orders of the defined types were placed in this encounter.  Medications administered: Maeva Dant had no medications administered during this visit.  See the medical record for exact dosing, route, and time of administration.  New Prescriptions   No medications on file   Disposition: Discharge home  Discharge Date & Time: 05/18/2017; 1153 hrs.    Physician-requested Follow-up: Return for Pump Refill (as per pump program) (Max:42mo.  Future Appointments  Date Time Provider [place] 07/11/2017 11:00 AM [facility]ilinda Pointer [facility]   Primary Care Physician: BVira Blanco [facility] Note by: Gaspar Cola, MD Date: 05/18/2017; Time: 12:36 PM  Disclaimer:  Medicine is not an Chief Strategy Officer. The only guarantee in medicine is that nothing is guaranteed. It is important to note that the decision to proceed with this intervention was based on the information collected from the patient. The Data and conclusions were drawn from the patient's questionnaire, the interview, and the physical examination. Because the information was provided in large part by the patient, it cannot be guaranteed that it has not been purposely or unconsciously manipulated. Every effort has been made to obtain as much relevant data as possible for this evaluation. It is important to note that the conclusions that lead to this procedure are derived in large part from the available data. Always take into account that the treatment will also be dependent on availability of resources and existing treatment guidelines, considered by other Pain Management Practitioners as being common knowledge and practice, at the time of the intervention. For Medico-Legal purposes, it is also important to point out that variation in procedural techniques and pharmacological choices are the acceptable norm. The indications, contraindications, technique, and results of the above procedure should only be interpreted and judged by a Board-Certified Interventional Pain Specialist with extensive familiarity and expertise in the same exact procedure and technique.

## 2017-05-19 ENCOUNTER — Telehealth: Payer: Self-pay

## 2017-05-19 NOTE — Telephone Encounter (Signed)
Post pump refill phone call.  Left message.   

## 2017-05-25 ENCOUNTER — Encounter: Payer: Medicare Other | Admitting: Pain Medicine

## 2017-06-08 MED FILL — Medication: INTRATHECAL | Qty: 1 | Status: AC

## 2017-06-15 ENCOUNTER — Other Ambulatory Visit: Payer: Self-pay

## 2017-06-15 MED ORDER — PAIN MANAGEMENT IT PUMP REFILL: 1.0000 | Freq: Once | INTRATHECAL | 0 refills | Status: AC

## 2017-07-11 ENCOUNTER — Other Ambulatory Visit: Payer: Self-pay

## 2017-07-11 ENCOUNTER — Encounter: Payer: Self-pay | Admitting: Pain Medicine

## 2017-07-11 ENCOUNTER — Ambulatory Visit: Payer: Medicare Other | Attending: Pain Medicine | Admitting: Pain Medicine

## 2017-07-11 VITALS — BP 133/87 | HR 100 | Temp 98.1°F | Resp 18 | Ht 65.0 in | Wt 102.0 lb

## 2017-07-11 DIAGNOSIS — M542 Cervicalgia: Secondary | ICD-10-CM | POA: Insufficient documentation

## 2017-07-11 DIAGNOSIS — G8194 Hemiplegia, unspecified affecting left nondominant side: Secondary | ICD-10-CM | POA: Insufficient documentation

## 2017-07-11 DIAGNOSIS — R252 Cramp and spasm: Secondary | ICD-10-CM | POA: Insufficient documentation

## 2017-07-11 DIAGNOSIS — M51369 Other intervertebral disc degeneration, lumbar region without mention of lumbar back pain or lower extremity pain: Secondary | ICD-10-CM | POA: Insufficient documentation

## 2017-07-11 DIAGNOSIS — G894 Chronic pain syndrome: Secondary | ICD-10-CM | POA: Diagnosis not present

## 2017-07-11 DIAGNOSIS — Z95828 Presence of other vascular implants and grafts: Secondary | ICD-10-CM | POA: Diagnosis not present

## 2017-07-11 DIAGNOSIS — G8929 Other chronic pain: Secondary | ICD-10-CM

## 2017-07-11 DIAGNOSIS — M79605 Pain in left leg: Secondary | ICD-10-CM | POA: Insufficient documentation

## 2017-07-11 DIAGNOSIS — M5136 Other intervertebral disc degeneration, lumbar region: Secondary | ICD-10-CM | POA: Insufficient documentation

## 2017-07-11 DIAGNOSIS — M5442 Lumbago with sciatica, left side: Secondary | ICD-10-CM

## 2017-07-11 NOTE — Progress Notes (Signed)
Safety precautions to be maintained throughout the outpatient stay will include: orient to surroundings, keep bed in low position, maintain call bell within reach at all times, provide assistance with transfer out of bed and ambulation.  

## 2017-07-11 NOTE — Progress Notes (Signed)
Patient's Name: Chelsea Parker  MRN: [id number]  Referring Provider: Ray Church D*  DOB: 03/12/1950  PCP: Vira Blanco, MD  DOS: 07/11/2017  Note by: Gaspar Cola, MD  Service setting: Ambulatory outpatient  Specialty: Interventional Pain Management  Patient type: Established  Location: [facility] (AMB) Pain Management Facility  Visit type: Interventional Procedure   Primary Reason for Visit: Interventional Pain Management Treatment. CC: Neck Pain  Procedure:  Intrathecal Drug Delivery System (IDDS):  Type: Reservoir Refill [phone]) No rate change Region: Abdominal Laterality: Left  Type of Pump: Medtronic Synchromed II (MRI-compatible) Delivery Route: Intrathecal Type of Pain Treated: Neuropathic/Nociceptive Primary Medication Class: Opioid/opiate  Medication, Concentration, Infusion Program, & Delivery Rate: Please see scanned programming printout.   Indications: 1. Presence of implanted infusion pump   2. DDD (degenerative disc disease), lumbar   3. Chronic pain syndrome   4. Chronic low back pain (Primary Area of Pain) (Bilateral) (L>R)   5. Chronic lower extremity pain (Secondary Area of Pain) (Left)   6. Hemiparesis (Left)   7. Spasticity    Pain Assessment: Self-Reported Pain Score: 5 /10             Reported level is compatible with observation.        Intrathecal Pump Therapy Assessment  Manufacturer: Medtronic Synchromed II Type: Programmable Volume: 20 mL reservoir MRI compatibility: Yes   Drug content:  Primary Medication Class:Opioid Primary Medication:PF-Hydromorphone (Dilaudid)7.5 mg/ml Secondary Medication:PF-Baclofen400 mcg/ml Other Medication:None   Programming:  Type: Simple continuous. See pump readout for details.   Changes:  Medication Change: None at this point Rate Change: No change in rate  Reported side-effects or adverse reactions: None reported  Effectiveness: Described as relatively effective, allowing  for increase in activities of daily living (ADL) Clinically meaningful improvement in function (CMIF): Sustained CMIF goals met  Plan: Pump refill today  Pre-op Assessment:  Chelsea Parker is a 67 y.o. (year old), female patient, seen today for interventional treatment. She  has a past surgical history that includes Abdominal hysterectomy; Knee surgery (Right); and IT pump implant (2016). Chelsea Parker has a current medication list which includes the following prescription(s): apixaban, atorvastatin, buspirone, cetirizine, docusate sodium, flavoxate, ondansetron, amlodipine, fluticasone, metoprolol succinate, and trazodone. Her primarily concern today is the Neck Pain  Initial Vital Signs:  Pulse/HCG Rate: 100  Temp: 98.1 F (36.7 C) Resp: 18 BP: 133/87 SpO2: 100 %  BMI: Estimated body mass index is 16.97 kg/m as calculated from the following:   Height as of this encounter: _0  (1.651 m).   Weight as of this encounter: 102 lb (46.3 kg).  Risk Assessment: Allergies: Reviewed. She has No Known Allergies.  Allergy Precautions: None required Coagulopathies: Reviewed. None identified.  Blood-thinner therapy: None at this time Active Infection(s): Reviewed. None identified. Chelsea Parker is afebrile  Site Confirmation: Chelsea Parker was asked to confirm the procedure and laterality before marking the site Procedure checklist: Completed Consent: Before the procedure and under the influence of no sedative(s), amnesic(s), or anxiolytics, the patient was informed of the treatment options, risks and possible complications. To fulfill our ethical and legal obligations, as recommended by the American Medical Association's Code of Ethics, I have informed the patient of my clinical impression; the nature and purpose of the treatment or procedure; the risks, benefits, and possible complications of the intervention; the alternatives, including doing nothing; the risk(s) and benefit(s) of the alternative  treatment(s) or procedure(s); and the risk(s) and benefit(s) of doing nothing.  Chelsea Parker was  provided with information about the general risks and possible complications associated with most interventional procedures. These include, but are not limited to: failure to achieve desired goals, infection, bleeding, organ or nerve damage, allergic reactions, paralysis, and/or death.  In addition, she was informed of those risks and possible complications associated to this particular procedure, which include, but are not limited to: damage to the implant; failure to decrease pain; local, systemic, or serious CNS infections, intraspinal abscess with possible cord compression and paralysis, or life-threatening such as meningitis; bleeding; organ damage; nerve injury or damage with subsequent sensory, motor, and/or autonomic system dysfunction, resulting in transient or permanent pain, numbness, and/or weakness of one or several areas of the body; allergic reactions, either minor or major life-threatening, such as anaphylactic or anaphylactoid reactions.  Furthermore, Chelsea Parker was informed of those risks and complications associated with the medications. These include, but are not limited to: allergic reactions (i.e.: anaphylactic or anaphylactoid reactions); endorphine suppression; bradycardia and/or hypotension; water retention and/or peripheral vascular relaxation leading to lower extremity edema and possible stasis ulcers; respiratory depression and/or shortness of breath; decreased metabolic rate leading to weight gain; swelling or edema; medication-induced neural toxicity; particulate matter embolism and blood vessel occlusion with resultant organ, and/or nervous system infarction; and/or intrathecal granuloma formation with possible spinal cord compression and permanent paralysis.  Before refilling the pump Chelsea Parker was informed that some of the medications used in the devise may not be FDA approved  for such use and therefore it constitutes an off-label use of the medications.  Finally, she was informed that Medicine is not an exact science; therefore, there is also the possibility of unforeseen or unpredictable risks and/or possible complications that may result in a catastrophic outcome. The patient indicated having understood very clearly. We have given the patient no guarantees and we have made no promises. Enough time was given to the patient to ask questions, all of which were answered to the patient's satisfaction. Ms. Bohlin has indicated that she wanted to continue with the procedure. Attestation: I, the ordering provider, attest that I have discussed with the patient the benefits, risks, side-effects, alternatives, likelihood of achieving goals, and potential problems during recovery for the procedure that I have provided informed consent. Date  Time: 07/11/2017 11:15 AM  Pre-Procedure Preparation:  Monitoring: As per clinic protocol. Respiration, ETCO2, SpO2, BP, heart rate and rhythm monitor placed and checked for adequate function Safety Precautions: Patient was assessed for positional comfort and pressure points before starting the procedure. Time-out: I initiated and conducted the "Time-out" before starting the procedure, as per protocol. The patient was asked to participate by confirming the accuracy of the "Time Out" information. Verification of the correct person, site, and procedure were performed and confirmed by me, the nursing staff, and the patient. "Time-out" conducted as per Joint Commission's Universal Protocol (UP.01.01.01). Time: 1140  Description of Procedure Process:   Position: Supine Target Area: Central-port of intrathecal pump. Approach: Anterior, 90 degree angle approach. Area Prepped: Entire Area around the pump implant. Prepping solution: ChloraPrep (2% chlorhexidine gluconate and 70% isopropyl alcohol) Safety Precautions: Aspiration looking for blood  return was conducted prior to all injections. At no point did we inject any substances, as a needle was being advanced. No attempts were made at seeking any paresthesias. Safe injection practices and needle disposal techniques used. Medications properly checked for expiration dates. SDV (single dose vial) medications used. Description of the Procedure: Protocol guidelines were followed. Two nurses trained to do implant refills  were present during the entire procedure. The refill medication was checked by both healthcare providers as well as the patient. The patient was included in the "Time-out" to verify the medication. The patient was placed in position. The pump was identified. The area was prepped in the usual manner. The sterile template was positioned over the pump, making sure the side-port location matched that of the pump. Both, the pump and the template were held for stability. The needle provided in the Medtronic Kit was then introduced thru the center of the template and into the central port. The pump content was aspirated and discarded volume documented. The new medication was slowly infused into the pump, thru the filter, making sure to avoid overpressure of the device. The needle was then removed and the area cleansed, making sure to leave some of the prepping solution back to take advantage of its long term bactericidal properties. The pump was interrogated and programmed to reflect the correct medication, volume, and dosage. The program was printed and taken to the physician for approval. Once checked and signed by the physician, a copy was provided to the patient and another scanned into the EMR. Vitals:   07/11/17 1113  BP: 133/87  Pulse: 100  Resp: 18  Temp: 98.1 F (36.7 C)  TempSrc: Oral  SpO2: 100%  Weight: 102 lb (46.3 kg)  Height: _0  (1.651 m)    Start Time: 1140 hrs. End Time: 1152 hrs. Materials & Medications: Medtronic Refill Kit Medication(s): Please see chart orders  for details.  Imaging Guidance:  Type of Imaging Technique: None used Indication(s): N/A Exposure Time: No patient exposure Contrast: None used. Fluoroscopic Guidance: N/A Ultrasound Guidance: N/A Interpretation: N/A  Antibiotic Prophylaxis:   Anti-infectives (From admission, onward)   None     Indication(s): None identified  Post-operative Assessment:  Post-procedure Vital Signs:  Pulse/HCG Rate: 100  Temp: 98.1 F (36.7 C) Resp: 18 BP: 133/87 SpO2: 100 %  EBL: None  Complications: No immediate post-treatment complications observed by team, or reported by patient.  Note: The patient tolerated the entire procedure well. A repeat set of vitals were taken after the procedure and the patient was kept under observation following institutional policy, for this type of procedure. Post-procedural neurological assessment was performed, showing return to baseline, prior to discharge. The patient was provided with post-procedure discharge instructions, including a section on how to identify potential problems. Should any problems arise concerning this procedure, the patient was given instructions to immediately contact us, at any time, without hesitation. In any case, we plan to contact the patient by telephone for a follow-up status report regarding this interventional procedure.  Comments:  No additional relevant information.  Plan of Care   Imaging Orders  No imaging studies ordered today    Procedure Orders     PUMP REFILL     PUMP REFILL  Medications ordered for procedure: No orders of the defined types were placed in this encounter.  Medications administered: Eugenia Eldredge had no medications administered during this visit.  See the medical record for exact dosing, route, and time of administration.  New Prescriptions   No medications on file   Disposition: Discharge home  Discharge Date & Time: 07/11/2017; 1215 hrs.   Physician-requested Follow-up: Return for  Pump Refill (as per pump program) (Max:109mo.  Future Appointments  Date Time [place] 09/07/2017 11:00 AM [facility]il[facility]   Primary Care Physician: BVira Blanco MD [facility] Note by: Gaspar Cola, MD Date: 07/11/2017; Time: 1:34 PM  Disclaimer:  Medicine is not an Chief Strategy Officer. The only guarantee in medicine is that nothing is guaranteed. It is important to note that the decision to proceed with this intervention was based on the information collected from the patient. The Data and conclusions were drawn from the patient's questionnaire, the interview, and the physical examination. Because the information was provided in large part by the patient, it cannot be guaranteed that it has not been purposely or unconsciously manipulated. Every effort has been made to obtain as much relevant data as possible for this evaluation. It is important to note that the conclusions that lead to this procedure are derived in large part from the available data. Always take into account that the treatment will also be dependent on availability of resources and existing treatment guidelines, considered by other Pain Management Practitioners as being common knowledge and practice, at the time of the intervention. For Medico-Legal purposes, it is also important to point out that variation in procedural techniques and pharmacological choices are the acceptable norm. The indications, contraindications, technique, and results of the above procedure should only be interpreted and judged by a Board-Certified Interventional Pain Specialist with extensive familiarity and expertise in the same exact procedure and technique.

## 2017-07-12 ENCOUNTER — Telehealth: Payer: Self-pay

## 2017-07-12 NOTE — Telephone Encounter (Signed)
Post procedure phone call.  Patient husband states she is doing well after her pump refill.

## 2017-07-16 MED FILL — Medication: INTRATHECAL | Qty: 1 | Status: AC

## 2017-08-02 ENCOUNTER — Telehealth: Payer: Self-pay | Admitting: Pain Medicine

## 2017-08-02 NOTE — Telephone Encounter (Signed)
Patient is moving back to [place] and will continue care thru previous physician. Please cancel all appt. And medications for pump refill.

## 2017-09-07 ENCOUNTER — Encounter: Payer: Medicare Other | Admitting: Pain Medicine

## 2019-01-17 IMAGING — CR DG THORACIC SPINE 2V
1 series · 3 of 3 positions shown · non-contrast
Comparison: Lumbar spine series of today's date

CLINICAL DATA: Chronic upper lower back pain since a CVA 4 years
ago.

EXAM:
THORACIC SPINE 2 VIEWS

[Series 1: dg thoracic spine 2 view · 0.14mm/px · 3 of 3 slices shown]
[im 1/3]
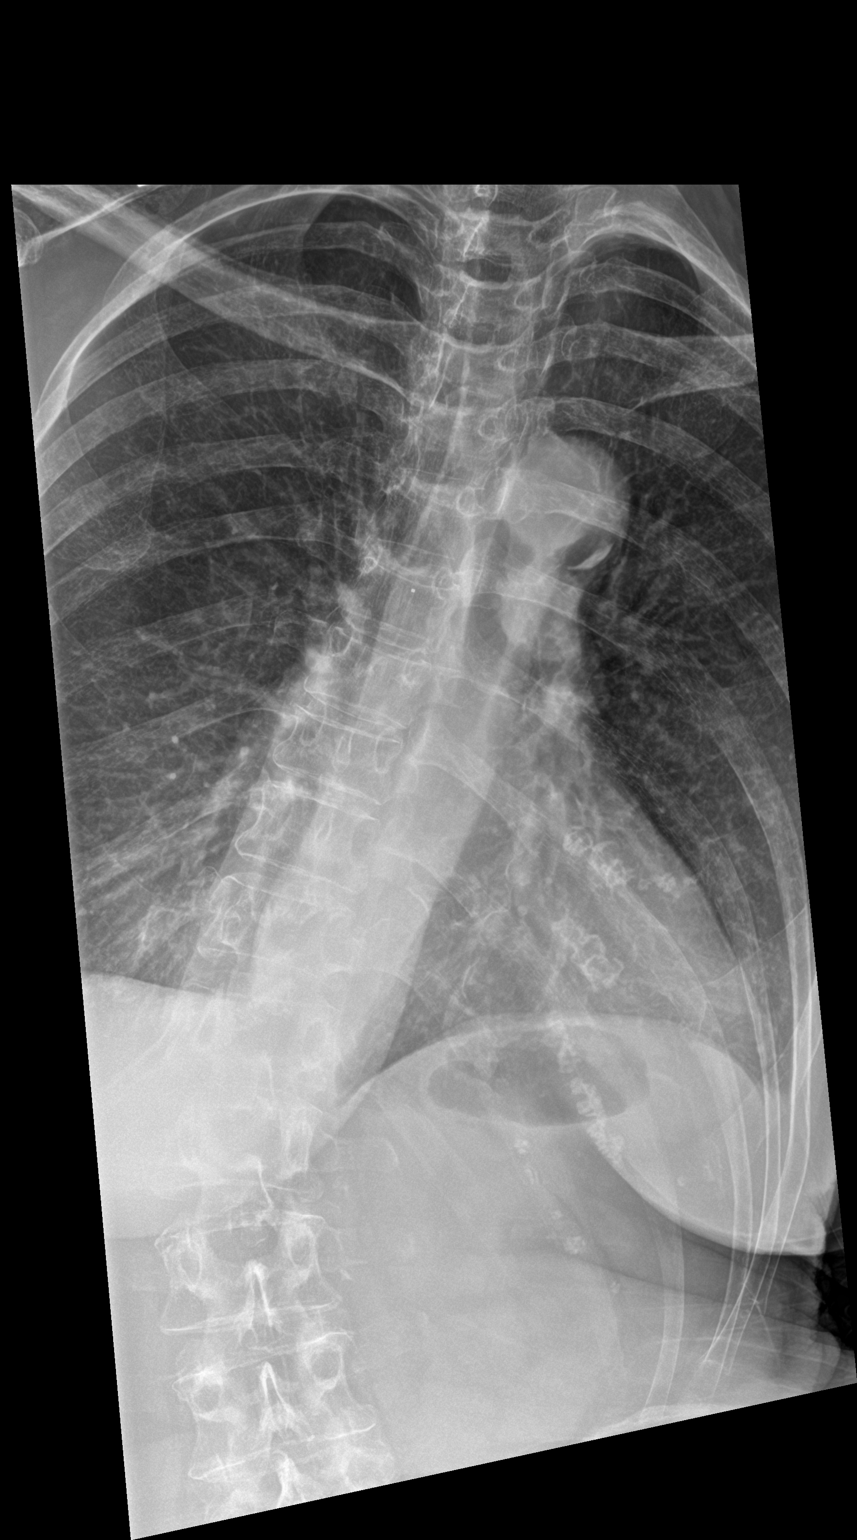
[im 2/3]
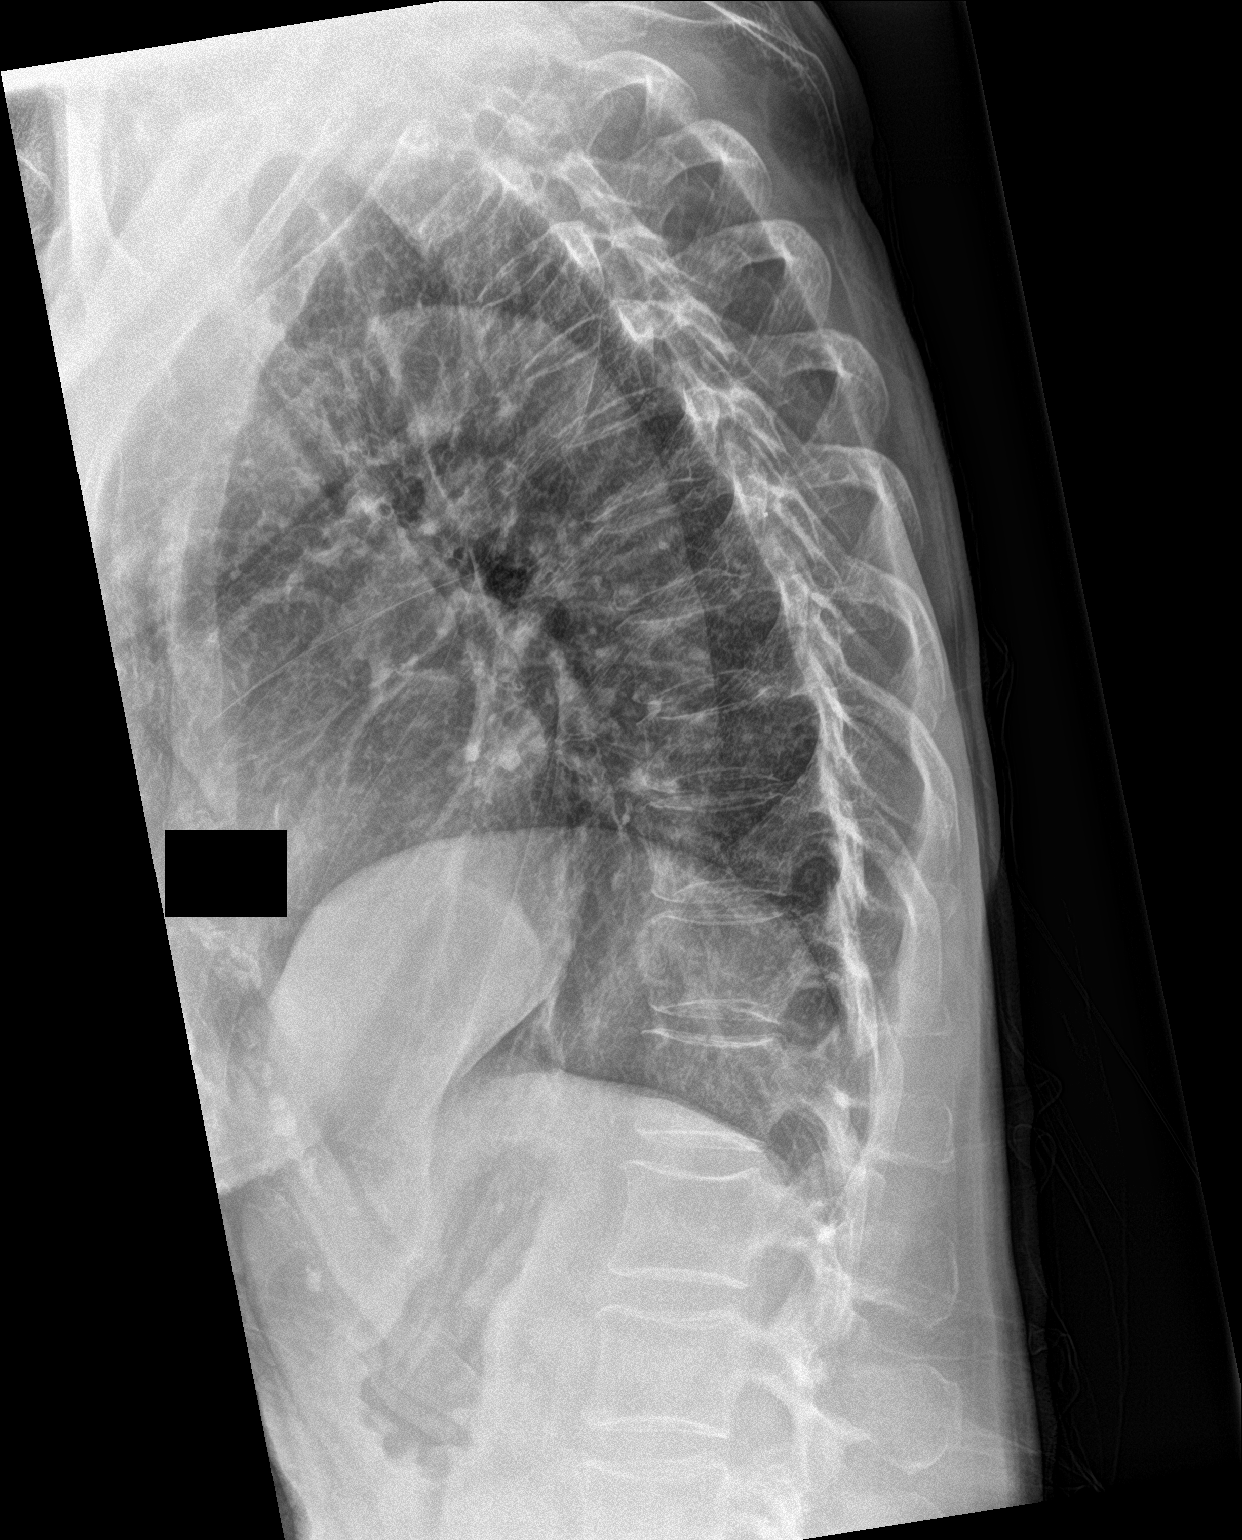
[im 3/3]
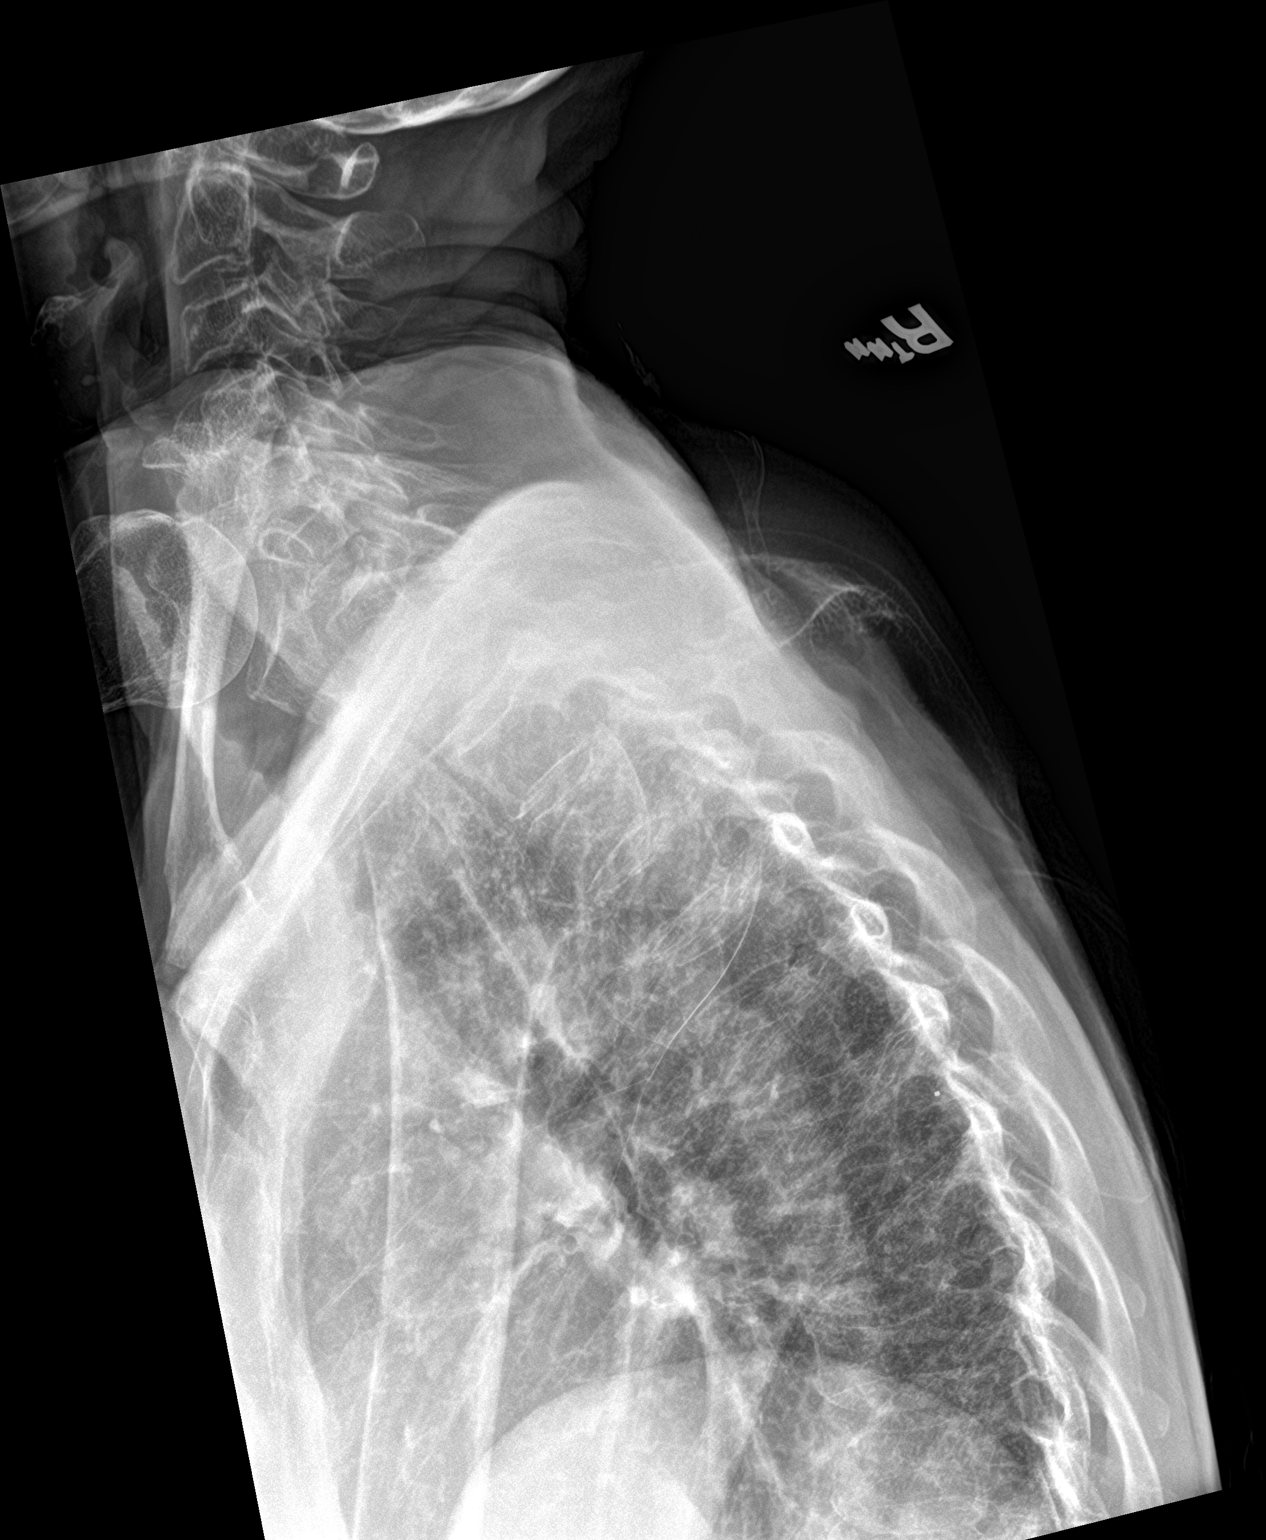

[3 of 3 positions shown; findings below may reference images not displayed]

FINDINGS: There is moderate thoraco lumbar dextrocurvature centered at T12-L1.
The thoracic vertebral bodies are preserved in height. The disc
space heights are reasonably well-maintained. The pedicles are
intact where visualized. There are no abnormal paravertebral soft
tissue densities. There is calcification in the wall of the aortic
arch.
IMPRESSION: No significant degenerative change of the thoracic spine. No
definite compression fracture. Moderate dextroscoliosis centered at
T12-L1.

Thoracic aortic atherosclerosis.
